# Patient Record
Sex: Female | Born: 1975
Health system: Southern US, Community
[De-identification: ages and names within clinical notes are randomized; demographics above are authoritative.]

## PROBLEM LIST (undated history)

## (undated) DIAGNOSIS — D219 Benign neoplasm of connective and other soft tissue, unspecified: Secondary | ICD-10-CM

## (undated) DIAGNOSIS — R062 Wheezing: Secondary | ICD-10-CM

## (undated) DIAGNOSIS — J069 Acute upper respiratory infection, unspecified: Secondary | ICD-10-CM

## (undated) DIAGNOSIS — Z973 Presence of spectacles and contact lenses: Secondary | ICD-10-CM

## (undated) DIAGNOSIS — J45909 Unspecified asthma, uncomplicated: Secondary | ICD-10-CM

## (undated) DIAGNOSIS — D649 Anemia, unspecified: Secondary | ICD-10-CM

## (undated) DIAGNOSIS — F988 Other specified behavioral and emotional disorders with onset usually occurring in childhood and adolescence: Secondary | ICD-10-CM

## (undated) DIAGNOSIS — N946 Dysmenorrhea, unspecified: Secondary | ICD-10-CM

## (undated) DIAGNOSIS — I10 Essential (primary) hypertension: Secondary | ICD-10-CM

## (undated) DIAGNOSIS — R87619 Unspecified abnormal cytological findings in specimens from cervix uteri: Secondary | ICD-10-CM

## (undated) DIAGNOSIS — IMO0002 Reserved for concepts with insufficient information to code with codable children: Secondary | ICD-10-CM

## (undated) HISTORY — DX: Essential (primary) hypertension: I10

## (undated) HISTORY — DX: Dysmenorrhea, unspecified: N94.6

## (undated) HISTORY — DX: Acute upper respiratory infection, unspecified: J06.9

## (undated) HISTORY — DX: Benign neoplasm of connective and other soft tissue, unspecified: D21.9

## (undated) HISTORY — DX: Unspecified abnormal cytological findings in specimens from cervix uteri: R87.619

## (undated) HISTORY — DX: Anemia, unspecified: D64.9

## (undated) HISTORY — DX: Wheezing: R06.2

## (undated) HISTORY — DX: Reserved for concepts with insufficient information to code with codable children: IMO0002

## (undated) HISTORY — PX: CERVICAL CONE BIOPSY: SUR198

---
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
## 2011-06-24 LAB — HM PAP SMEAR

## 2011-08-08 ENCOUNTER — Encounter: Payer: Self-pay | Admitting: Obstetrics & Gynecology

## 2011-08-16 ENCOUNTER — Encounter: Payer: Self-pay | Admitting: Obstetrics & Gynecology

## 2011-09-20 ENCOUNTER — Ambulatory Visit (INDEPENDENT_AMBULATORY_CARE_PROVIDER_SITE_OTHER): Payer: 59 | Admitting: Obstetrics & Gynecology

## 2011-09-20 ENCOUNTER — Encounter: Payer: Self-pay | Admitting: Obstetrics & Gynecology

## 2011-09-20 DIAGNOSIS — N920 Excessive and frequent menstruation with regular cycle: Secondary | ICD-10-CM | POA: Insufficient documentation

## 2011-09-20 DIAGNOSIS — D649 Anemia, unspecified: Secondary | ICD-10-CM | POA: Insufficient documentation

## 2011-09-20 DIAGNOSIS — Z87410 Personal history of cervical dysplasia: Secondary | ICD-10-CM | POA: Insufficient documentation

## 2011-09-20 DIAGNOSIS — N9489 Other specified conditions associated with female genital organs and menstrual cycle: Secondary | ICD-10-CM

## 2011-09-20 NOTE — Patient Instructions (Signed)

## 2011-09-20 NOTE — Progress Notes (Signed)
  Subjective:    Patient ID: Michelle Evans, female    DOB: 08/05/1975, 36 y.o.   MRN: [id number]  HPI 36 yo G2P2 sent to me for evaluation of abnormal appearing cervix.  Pt has history of LEEP vs CKC (pt was under general anesthesia at in the hosptial for procedure).  Last pap was satisfactory for interpretation and negative.  Pt has not had HPV testing.  Leep was 19 years ago.  Pt also has history of heavy menses and anemia.  This has been true her whole life.  She also has dysmenorrhea.     Review of Systems  Constitutional: Negative.   Respiratory: Negative.   Cardiovascular: Negative.   Gastrointestinal: Negative.   Genitourinary:       Dysmenorrhea        Objective:   Physical Exam  Vitals reviewed. Constitutional: She is oriented to person, place, and time. She appears well-developed and well-nourished. No distress.  HENT:  Head: Normocephalic and atraumatic.  Eyes: Conjunctivae are normal.  Pulmonary/Chest: Effort normal.  Abdominal: Soft. She exhibits no distension and no mass. There is no tenderness. There is no rebound and no guarding.  Genitourinary: Vagina normal. No vaginal discharge found.       Uterus anteverted and small.  ? Broad ligament fibroid vs adnexal mass.  No pain on exam.  No left adnexal mass.  Cervix has 3 nabothian cysts 5 o'clock, 11 o'clock, nd 12 o'clock.  There is also evidence of scarring from prior LEEP / CKC  Neurological: She is alert and oriented to person, place, and time.  Skin: Skin is warm and dry.  Psychiatric: She has a normal mood and affect.          Assessment & Plan:  36 yo female with nml pap smear and nabothian cysts.  History of severe dysplasia 19 yrs ago with LEEP vs CKC.   Pt also has dysmenorrhea, menorrhagia, and anemia.  1-HPV next visit in case lesion 19 years ago was glandular.  Would always use co-testing with this patient 2-right adnexal mass--TV US 3-RTC 2 weeks for results and plan.

## 2011-09-22 ENCOUNTER — Other Ambulatory Visit: Payer: Self-pay | Admitting: Obstetrics & Gynecology

## 2011-09-22 DIAGNOSIS — N9489 Other specified conditions associated with female genital organs and menstrual cycle: Secondary | ICD-10-CM

## 2011-09-25 ENCOUNTER — Ambulatory Visit (HOSPITAL_BASED_OUTPATIENT_CLINIC_OR_DEPARTMENT_OTHER)
Admission: RE | Admit: 2011-09-25 | Discharge: 2011-09-25 | Disposition: A | Discharge status: Home / Self Care | Payer: 59 | Source: Ambulatory Visit | Attending: Obstetrics & Gynecology | Admitting: Obstetrics & Gynecology

## 2011-09-25 ENCOUNTER — Ambulatory Visit (INDEPENDENT_AMBULATORY_CARE_PROVIDER_SITE_OTHER)
Admission: RE | Admit: 2011-09-25 | Discharge: 2011-09-25 | Disposition: A | Discharge status: Home / Self Care | Payer: 59 | Source: Ambulatory Visit | Attending: Obstetrics & Gynecology | Admitting: Obstetrics & Gynecology

## 2011-09-25 DIAGNOSIS — N920 Excessive and frequent menstruation with regular cycle: Secondary | ICD-10-CM

## 2011-09-25 DIAGNOSIS — N9489 Other specified conditions associated with female genital organs and menstrual cycle: Secondary | ICD-10-CM

## 2011-09-25 DIAGNOSIS — D251 Intramural leiomyoma of uterus: Secondary | ICD-10-CM | POA: Insufficient documentation

## 2013-02-19 ENCOUNTER — Ambulatory Visit (INDEPENDENT_AMBULATORY_CARE_PROVIDER_SITE_OTHER): Payer: 59 | Admitting: Family Medicine

## 2013-02-19 ENCOUNTER — Encounter: Payer: Self-pay | Admitting: Family Medicine

## 2013-02-19 VITALS — BP 110/78 | HR 80 | Wt 177.0 lb

## 2013-02-19 DIAGNOSIS — R5381 Other malaise: Secondary | ICD-10-CM

## 2013-02-19 DIAGNOSIS — R5383 Other fatigue: Secondary | ICD-10-CM

## 2013-02-19 DIAGNOSIS — E663 Overweight: Secondary | ICD-10-CM

## 2013-02-19 DIAGNOSIS — F17200 Nicotine dependence, unspecified, uncomplicated: Secondary | ICD-10-CM

## 2013-02-19 MED ORDER — CYANOCOBALAMIN 1000 MCG/ML IJ SOLN: 1000.0000 ug | INTRAMUSCULAR | Status: DC

## 2013-02-19 MED ORDER — PHENTERMINE HCL 37.5 MG PO TABS: 37.5000 mg | ORAL_TABLET | Freq: Every day | ORAL | Status: DC

## 2013-02-19 NOTE — Patient Instructions (Addendum)
1)  Fatigue - Inject 1 ml of your B-12 monthly.    2)  Smoking Cessation - Read or listen to "The Easy Way To Quit Smoking" by Malachi Pro.  3)  Weight - Take 1/2 - 1 of Phentermine and limit your calories to 1250 or less.     Smoking Cessation  Quitting smoking is important to your health and has many advantages. However, it is not always easy to quit since nicotine is a very addictive drug. Often times, people try 3 times or more before being able to quit. This document explains the best ways for you to prepare to quit smoking. Quitting takes hard work and a lot of effort, but you can do it. ADVANTAGES OF QUITTING SMOKING  You will live longer, feel better, and live better.  Your body will feel the impact of quitting smoking almost immediately.  Within 20 minutes, blood pressure decreases. Your pulse returns to its normal level.  After 8 hours, carbon monoxide levels in the blood return to normal. Your oxygen level increases.  After 24 hours, the chance of having a heart attack starts to decrease. Your breath, hair, and body stop smelling like smoke.  After 48 hours, damaged nerve endings begin to recover. Your sense of taste and smell improve.  After 72 hours, the body is virtually free of nicotine. Your bronchial tubes relax and breathing becomes easier.  After 2 to 12 weeks, lungs can hold more air. Exercise becomes easier and circulation improves.  The risk of having a heart attack, stroke, cancer, or lung disease is greatly reduced.  After 1 year, the risk of coronary heart disease is cut in half.  After 5 years, the risk of stroke falls to the same as a nonsmoker.  After 10 years, the risk of lung cancer is cut in half and the risk of other cancers decreases significantly.  After 15 years, the risk of coronary heart disease drops, usually to the level of a nonsmoker.  If you are pregnant, quitting smoking will improve your chances of having a healthy baby.  The people  you live with, especially any children, will be healthier.  You will have extra money to spend on things other than cigarettes. QUESTIONS TO THINK ABOUT BEFORE ATTEMPTING TO QUIT You may want to talk about your answers with your caregiver.  Why do you want to quit?  If you tried to quit in the past, what helped and what did not?  What will be the most difficult situations for you after you quit? How will you plan to handle them?  Who can help you through the tough times? Your family? Friends? A caregiver?  What pleasures do you get from smoking? What ways can you still get pleasure if you quit? Here are some questions to ask your caregiver:  How can you help me to be successful at quitting?  What medicine do you think would be best for me and how should I take it?  What should I do if I need more help?  What is smoking withdrawal like? How can I get information on withdrawal? GET READY  Set a quit date.  Change your environment by getting rid of all cigarettes, ashtrays, matches, and lighters in your home, car, or work. Do not let people smoke in your home.  Review your past attempts to quit. Think about what worked and what did not. GET SUPPORT AND ENCOURAGEMENT You have a better chance of being successful if you have  help. You can get support in many ways.  Tell your family, friends, and co-workers that you are going to quit and need their support. Ask them not to smoke around you.  Get individual, group, or telephone counseling and support. Programs are available at [employer] and health centers. Call your local health department for information about programs in your area.  Spiritual beliefs and practices may help some smokers quit.  Download a "quit meter" on your computer to keep track of quit statistics, such as how long you have gone without smoking, cigarettes not smoked, and money saved.  Get a self-help book about quitting smoking and staying off of  tobacco. LEARN NEW SKILLS AND BEHAVIORS  Distract yourself from urges to smoke. Talk to someone, go for a walk, or occupy your time with a task.  Change your normal routine. Take a different route to work. Drink tea instead of coffee. Eat breakfast in a different place.  Reduce your stress. Take a hot bath, exercise, or read a book.  Plan something enjoyable to do every day. Reward yourself for not smoking.  Explore interactive web-based programs that specialize in helping you quit. GET MEDICINE AND USE IT CORRECTLY Medicines can help you stop smoking and decrease the urge to smoke. Combining medicine with the above behavioral methods and support can greatly increase your chances of successfully quitting smoking.  Nicotine replacement therapy helps deliver nicotine to your body without the negative effects and risks of smoking. Nicotine replacement therapy includes nicotine gum, lozenges, inhalers, nasal sprays, and skin patches. Some may be available over-the-counter and others require a prescription.  Antidepressant medicine helps people abstain from smoking, but how this works is unknown. This medicine is available by prescription.  Nicotinic receptor partial agonist medicine simulates the effect of nicotine in your brain. This medicine is available by prescription. Ask your caregiver for advice about which medicines to use and how to use them based on your health history. Your caregiver will tell you what side effects to look out for if you choose to be on a medicine or therapy. Carefully read the information on the package. Do not use any other product containing nicotine while using a nicotine replacement product.  RELAPSE OR DIFFICULT SITUATIONS Most relapses occur within the first 3 months after quitting. Do not be discouraged if you start smoking again. Remember, most people try several times before finally quitting. You may have symptoms of withdrawal because your body is used to  nicotine. You may crave cigarettes, be irritable, feel very hungry, cough often, get headaches, or have difficulty concentrating. The withdrawal symptoms are only temporary. They are strongest when you first quit, but they will go away within 10 14 days. To reduce the chances of relapse, try to:  Avoid drinking alcohol. Drinking lowers your chances of successfully quitting.  Reduce the amount of caffeine you consume. Once you quit smoking, the amount of caffeine in your body increases and can give you symptoms, such as a rapid heartbeat, sweating, and anxiety.  Avoid smokers because they can make you want to smoke.  Do not let weight gain distract you. Many smokers will gain weight when they quit, usually less than 10 pounds. Eat a healthy diet and stay active. You can always lose the weight gained after you quit.  Find ways to improve your mood other than smoking. FOR MORE INFORMATION  www.smokefree.gov  Document Released: 07/04/2001 Document Revised: 01/09/2012 Document Reviewed: 10/19/2011 [facility] Patient Information 2014 [place], [place].

## 2013-02-19 NOTE — Progress Notes (Signed)
  Subjective:    Patient ID: Michelle Evans, female    DOB: 1976/04/24, 37 y.o.   MRN: [id number]  HPI  Michelle Evans is here today for a couple of issues.  She needs to have her Vitamin B-12 refilled.  She feels that it definitely improves her fatigue.  She would also like some help with her weight.   She has been able to maintain her weight since doing the HCG program a year ago.  She has reduced her sugar intake and her portions.  She would like to get a refill for the Phentermine.  She also continues to smoke.  She is not really ready to quit at this time.  One concern she has is that she may gain weight.    Review of Systems  Constitutional: Positive for fatigue. Negative for appetite change and unexpected weight change.  Cardiovascular: Negative for palpitations.  Neurological: Negative for light-headedness.   Past Medical History  Diagnosis Date  . Anemia   . Abnormal Pap smear     18 yrs ago  cone biopsy  . Dysmenorrhea   . Fibroid tumor   . Acute upper respiratory infections of other multiple sites   . Wheezing    Family History  Problem Relation Age of Onset  . Diabetes Maternal Grandmother   . Hypertension Maternal Grandmother   . Heart disease Maternal Grandmother   . Hypertension Mother   . Cancer Maternal Grandfather     colon and prostate  . 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    Education: Some College    Tobacco Use/Exposure:  Current every day smoker.  She smokes about 5 cigarettes per day. She has smoked for 16 years.     Alcohol Use:  Occasional (Mixed Drinks)    Drug Use:  None   Diet:  Regular   Exercise:  Waking 2-3 miles per week    Hobbies: Reading, Shopping                Objective:   Physical Exam  Constitutional:  She appears well-nourished. No distress.  HENT:  Head: Normocephalic.  Eyes: No scleral icterus.  Neck: No thyromegaly present.  Cardiovascular: Normal rate, regular rhythm and normal heart sounds.   Pulmonary/Chest: Effort normal and breath sounds normal.  Abdominal: There is no tenderness.  Musculoskeletal: She exhibits no edema and no tenderness.  Neurological: She is alert.  Skin: Skin is warm and dry.  Psychiatric: She has a normal mood and affect. Her behavior is normal. Judgment and thought content normal.          Assessment & Plan:

## 2013-02-24 DIAGNOSIS — R5381 Other malaise: Secondary | ICD-10-CM | POA: Insufficient documentation

## 2013-02-24 DIAGNOSIS — F17200 Nicotine dependence, unspecified, uncomplicated: Secondary | ICD-10-CM | POA: Insufficient documentation

## 2013-02-24 DIAGNOSIS — R5383 Other fatigue: Secondary | ICD-10-CM | POA: Insufficient documentation

## 2013-02-24 DIAGNOSIS — E663 Overweight: Secondary | ICD-10-CM | POA: Insufficient documentation

## 2013-02-24 NOTE — Assessment & Plan Note (Addendum)
She admits that she is really not ready to quit right now.  We might put her on Wellbutrin after she completes a month of the Phentermine.  She may be more ready once she lose another 10-15 lbs.  She is to look into reading [employer] book on "The Easy Way To Quit Smoking".

## 2013-02-24 NOTE — Assessment & Plan Note (Signed)
She will work hard on her diet and exercise for the next 4-6 weeks.

## 2013-02-24 NOTE — Assessment & Plan Note (Signed)
She was given a refill for her Vitamin B-12.

## 2013-09-17 ENCOUNTER — Encounter: Payer: Self-pay | Admitting: Family Medicine

## 2013-09-17 ENCOUNTER — Ambulatory Visit (INDEPENDENT_AMBULATORY_CARE_PROVIDER_SITE_OTHER): Payer: 59 | Admitting: Family Medicine

## 2013-09-17 VITALS — BP 119/80 | HR 78 | Resp 16 | Wt 183.0 lb

## 2013-09-17 DIAGNOSIS — R05 Cough: Secondary | ICD-10-CM

## 2013-09-17 DIAGNOSIS — R059 Cough, unspecified: Secondary | ICD-10-CM

## 2013-09-17 DIAGNOSIS — R0981 Nasal congestion: Secondary | ICD-10-CM

## 2013-09-17 DIAGNOSIS — J3489 Other specified disorders of nose and nasal sinuses: Secondary | ICD-10-CM

## 2013-09-17 DIAGNOSIS — B373 Candidiasis of vulva and vagina: Secondary | ICD-10-CM

## 2013-09-17 DIAGNOSIS — J329 Chronic sinusitis, unspecified: Secondary | ICD-10-CM

## 2013-09-17 DIAGNOSIS — B3731 Acute candidiasis of vulva and vagina: Secondary | ICD-10-CM

## 2013-09-17 MED ORDER — AZITHROMYCIN 500 MG PO TABS: 500.0000 mg | ORAL_TABLET | Freq: Every day | ORAL | Status: AC

## 2013-09-17 MED ORDER — FLUCONAZOLE 150 MG PO TABS: 150.0000 mg | ORAL_TABLET | Freq: Once | ORAL | Status: DC

## 2013-09-17 MED ORDER — MOMETASONE FUROATE 50 MCG/ACT NA SUSP: 2.0000 | Freq: Every day | NASAL | Status: DC

## 2013-09-17 MED ORDER — TERCONAZOLE 0.8 % VA CREA: 1.0000 | TOPICAL_CREAM | Freq: Every day | VAGINAL | Status: AC

## 2013-09-17 MED ORDER — HYDROCOD POLST-CHLORPHEN POLST 10-8 MG/5ML PO LQCR: 5.0000 mL | Freq: Two times a day (BID) | ORAL | Status: DC | PRN

## 2013-09-17 MED ORDER — IBUPROFEN 800 MG PO TABS: 800.0000 mg | ORAL_TABLET | Freq: Three times a day (TID) | ORAL | Status: DC | PRN

## 2013-09-17 MED ORDER — METHYLPREDNISOLONE SODIUM SUCC 125 MG IJ SOLR
125.0000 mg | Freq: Once | INTRAMUSCULAR | Status: AC
  Administered 2013-09-17: 125 mg via INTRAMUSCULAR

## 2013-09-17 NOTE — Progress Notes (Signed)
Subjective:    Patient ID: Michelle Evans, female    DOB: 03/24/1976, 38 y.o.   MRN: [id number]  Michelle Evans is here today complaining of pain and pressure in the left side of her face.  She feels that she may have a sinus infection.    Sinus Problem The current episode started 1 to 4 weeks ago. The problem has been gradually worsening since onset. There has been no fever. Her pain is at a severity of 5/10. The pain is moderate. Associated symptoms include congestion, coughing, ear pain, headaches and sinus pressure. Pertinent negatives include no chills. (Left) Treatments tried: Mucinex, Sudafed, Ibuprofen, Nyquil and Saline rinse and Zyrtec. The treatment provided no relief.    Review of Systems  Constitutional: Negative for fever and chills.  HENT: Positive for congestion, ear pain, postnasal drip, rhinorrhea and sinus pressure.        Left Side  Eyes: Negative.   Respiratory: Positive for cough.   Neurological: Positive for headaches.     Past Medical History  Diagnosis Date  . Anemia   . Abnormal Pap smear     18 yrs ago  cone biopsy  . Dysmenorrhea   . Fibroid tumor   . Acute upper respiratory infections of other multiple sites   . Wheezing      Past Surgical History  Procedure Laterality Date  . 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    Education: Some College    Tobacco Use/Exposure:  Current every day smoker.  She smokes about 5 cigarettes per day. She has smoked for 16 years.     Alcohol Use:  Occasional (Mixed Drinks)    Drug Use:  None   Diet:  Regular   Exercise:  Waking 2-3 miles per week    Hobbies: Reading, Shopping              Family History  Problem Relation Age of Onset  . Diabetes Maternal Grandmother   .  Hypertension Maternal Grandmother   . Heart disease Maternal Grandmother   . Hypertension Mother   . Cancer Maternal Grandfather     colon and prostate  . CVA Maternal Grandfather      Current Outpatient Prescriptions on File Prior to Visit  Medication Sig Dispense Refill  . albuterol (PROVENTIL HFA;VENTOLIN HFA) 108 (90 BASE) MCG/ACT inhaler Inhale 2 puffs into the lungs every 6 (six) hours as needed for wheezing.      . cyanocobalamin (,VITAMIN B-12,) 1000 MCG/ML injection Inject 1 mL (1,000 mcg total) into the muscle every 30 (thirty) days.  30 mL  1  . pseudoephedrine (SUDAFED) 30 MG tablet Take 30 mg by mouth every 4 (four) hours as needed for congestion.       No current facility-administered medications on file prior to visit.     Allergies  Allergen Reactions  . Theophyllines Hives and Itching       Objective:   Physical Exam  Constitutional: She appears well-nourished. No distress.  HENT:  Head: Normocephalic.  Left Ear: Hearing and tympanic membrane normal. There is tenderness. No drainage.  Nose: Left sinus exhibits maxillary sinus tenderness and frontal sinus tenderness.  Mouth/Throat: No oropharyngeal exudate.  Eyes: Conjunctivae are normal. Right eye exhibits no discharge. Left eye exhibits no discharge.  Neck: Neck supple.  Cardiovascular: Normal rate, regular rhythm and normal heart sounds.  Exam reveals no gallop and no friction rub.   No murmur heard. Pulmonary/Chest: Effort normal and breath sounds normal. She has no wheezes. She exhibits no tenderness.  Lymphadenopathy:    She has no cervical adenopathy.  Neurological: She is alert.  Skin: Skin is warm and dry. No rash noted.  Psychiatric: She has a normal mood and affect.      Assessment & Plan:    Omega was seen today for sinus problem.  Diagnoses and associated orders for this visit:  Cough - chlorpheniramine-HYDROcodone (TUSSIONEX PENNKINETIC ER) 10-8 MG/5ML LQCR; Take 5 mLs by mouth every  12 (twelve) hours as needed.  Sinus infection - azithromycin (ZITHROMAX) 500 MG tablet; Take 1 tablet (500 mg total) by mouth daily. Take 1 tablet daily for 3 days. - ibuprofen (ADVIL,MOTRIN) 800 MG tablet; Take 1 tablet (800 mg total) by mouth every 8 (eight) hours as needed. - methylPREDNISolone sodium succinate (SOLU-MEDROL) 125 mg/2 mL injection 125 mg; Inject 2 mLs (125 mg total) into the muscle once.  Nasal congestion - mometasone (NASONEX) 50 MCG/ACT nasal spray; Place 2 sprays into the nose daily.  Candidiasis of female genitalia - fluconazole (DIFLUCAN) 150 MG tablet; Take 1 tablet (150 mg total) by mouth once. - terconazole (TERAZOL 3) 0.8 % vaginal cream; Place 1 applicator vaginally at bedtime.

## 2013-09-18 ENCOUNTER — Ambulatory Visit: Payer: 59 | Admitting: Family Medicine

## 2013-09-18 DIAGNOSIS — R0981 Nasal congestion: Secondary | ICD-10-CM | POA: Insufficient documentation

## 2013-09-18 DIAGNOSIS — R059 Cough, unspecified: Secondary | ICD-10-CM | POA: Insufficient documentation

## 2013-09-18 DIAGNOSIS — J329 Chronic sinusitis, unspecified: Secondary | ICD-10-CM | POA: Insufficient documentation

## 2013-09-18 DIAGNOSIS — B3731 Acute candidiasis of vulva and vagina: Secondary | ICD-10-CM | POA: Insufficient documentation

## 2013-09-18 DIAGNOSIS — B373 Candidiasis of vulva and vagina: Secondary | ICD-10-CM | POA: Insufficient documentation

## 2013-09-18 DIAGNOSIS — R05 Cough: Secondary | ICD-10-CM | POA: Insufficient documentation

## 2013-09-18 NOTE — Patient Instructions (Signed)
1)  Sinus Congestion - You received an injection of Solu-Medrol at today's visit; Take a combination of Mucinex D in the morning and Zyrtec at night; Use Nasonex at night.  If you don't improve or worsen, you can start the Zithromax.    2)  Cough - Tussionex as needed  Sinus Headache A sinus headache is when your sinuses become clogged or swollen. Sinus headaches can range from mild to severe.  CAUSES A sinus headache can have different causes, such as:  Colds.  Sinus infections.  Allergies. SYMPTOMS  Symptoms of a sinus headache may vary and can include:  Headache.  Pain or pressure in the face.  Congested or runny nose.  Fever.  Inability to smell.  Pain in upper teeth. Weather changes can make symptoms worse. TREATMENT  The treatment of a sinus headache depends on the cause.  Sinus pain caused by a sinus infection may be treated with antibiotic medicine.  Sinus pain caused by allergies may be helped by allergy medicines (antihistamines) and medicated nasal sprays.  Sinus pain caused by congestion may be helped by flushing the nose and sinuses with saline solution. HOME CARE INSTRUCTIONS   If antibiotics are prescribed, take them as directed. Finish them even if you start to feel better.  Only take over-the-counter or prescription medicines for pain, discomfort, or fever as directed by your caregiver.  If you have congestion, use a nasal spray to help reduce pressure. SEEK IMMEDIATE MEDICAL CARE IF:  You have a fever.  You have headaches more than once a week.  You have sensitivity to light or sound.  You have repeated nausea and vomiting.  You have vision problems.  You have sudden, severe pain in your face or head.  You have a seizure.  You are confused.  Your sinus headaches do not get better after treatment. Many people think they have a sinus headache when they actually have migraines or tension headaches. MAKE SURE YOU:   Understand these  instructions.  Will watch your condition.  Will get help right away if you are not doing well or get worse. Document Released: 08/17/2004 Document Revised: 10/02/2011 Document Reviewed: 10/08/2010 [facility] Patient Information 2014 [place].

## 2014-01-05 ENCOUNTER — Other Ambulatory Visit: Payer: Self-pay | Admitting: *Deleted

## 2014-01-05 DIAGNOSIS — Z0000 Encounter for general adult medical examination without abnormal findings: Secondary | ICD-10-CM

## 2014-01-06 ENCOUNTER — Other Ambulatory Visit: Payer: 59

## 2014-01-06 LAB — COMPLETE METABOLIC PANEL WITH GFR
ALT: 11 U/L (ref 0–35)
AST: 13 U/L (ref 0–37)
Albumin: 4.3 g/dL (ref 3.5–5.2)
Alkaline Phosphatase: 47 U/L (ref 39–117)
BUN: 10 mg/dL (ref 6–23)
CO2: 20 mEq/L (ref 19–32)
Calcium: 9.3 mg/dL (ref 8.4–10.5)
Chloride: 105 mEq/L (ref 96–112)
Creat: 0.99 mg/dL (ref 0.50–1.10)
GFR, Est African American: 84 mL/min
GFR, Est Non African American: 73 mL/min
Glucose, Bld: 80 mg/dL (ref 70–99)
Potassium: 4.2 mEq/L (ref 3.5–5.3)
Sodium: 138 mEq/L (ref 135–145)
Total Bilirubin: 0.9 mg/dL (ref 0.2–1.2)
Total Protein: 6.9 g/dL (ref 6.0–8.3)

## 2014-01-06 LAB — CBC WITH DIFFERENTIAL/PLATELET
Basophils Absolute: 0 10*3/uL (ref 0.0–0.1)
Basophils Relative: 0 % (ref 0–1)
Eosinophils Absolute: 0.2 10*3/uL (ref 0.0–0.7)
Eosinophils Relative: 2 % (ref 0–5)
HCT: 39.8 % (ref 36.0–46.0)
Hemoglobin: 13.2 g/dL (ref 12.0–15.0)
Lymphocytes Relative: 33 % (ref 12–46)
Lymphs Abs: 2.6 10*3/uL (ref 0.7–4.0)
MCH: 30.3 pg (ref 26.0–34.0)
MCHC: 33.2 g/dL (ref 30.0–36.0)
MCV: 91.5 fL (ref 78.0–100.0)
Monocytes Absolute: 0.6 10*3/uL (ref 0.1–1.0)
Monocytes Relative: 8 % (ref 3–12)
Neutro Abs: 4.4 10*3/uL (ref 1.7–7.7)
Neutrophils Relative %: 57 % (ref 43–77)
Platelets: 221 10*3/uL (ref 150–400)
RBC: 4.35 MIL/uL (ref 3.87–5.11)
RDW: 13.6 % (ref 11.5–15.5)
WBC: 7.8 10*3/uL (ref 4.0–10.5)

## 2014-01-06 LAB — TSH: TSH: 1.951 u[IU]/mL (ref 0.350–4.500)

## 2014-01-06 LAB — LIPID PANEL
Cholesterol: 170 mg/dL (ref 0–200)
HDL: 59 mg/dL (ref >39)
LDL Cholesterol: 92 mg/dL (ref 0–99)
Total CHOL/HDL Ratio: 2.9 Ratio
Triglycerides: 94 mg/dL (ref <150)
VLDL: 19 mg/dL (ref 0–40)

## 2014-01-07 LAB — VITAMIN D 25 HYDROXY (VIT D DEFICIENCY, FRACTURES): Vit D, 25-Hydroxy: 15 ng/mL — ABNORMAL LOW (ref 30–89)

## 2014-01-12 ENCOUNTER — Other Ambulatory Visit: Payer: 59 | Admitting: Family Medicine

## 2014-01-26 ENCOUNTER — Ambulatory Visit (INDEPENDENT_AMBULATORY_CARE_PROVIDER_SITE_OTHER): Payer: 59 | Admitting: Family Medicine

## 2014-01-26 ENCOUNTER — Encounter: Payer: Self-pay | Admitting: Family Medicine

## 2014-01-26 ENCOUNTER — Other Ambulatory Visit (HOSPITAL_COMMUNITY)
Admission: RE | Admit: 2014-01-26 | Discharge: 2014-01-26 | Disposition: A | Discharge status: Home / Self Care | Payer: 59 | Source: Ambulatory Visit | Attending: Family Medicine | Admitting: Family Medicine

## 2014-01-26 VITALS — BP 136/84 | HR 94 | Resp 16 | Ht 67.5 in | Wt 183.0 lb

## 2014-01-26 DIAGNOSIS — R5383 Other fatigue: Secondary | ICD-10-CM

## 2014-01-26 DIAGNOSIS — Z1151 Encounter for screening for human papillomavirus (HPV): Secondary | ICD-10-CM | POA: Insufficient documentation

## 2014-01-26 DIAGNOSIS — K648 Other hemorrhoids: Secondary | ICD-10-CM

## 2014-01-26 DIAGNOSIS — R41840 Attention and concentration deficit: Secondary | ICD-10-CM

## 2014-01-26 DIAGNOSIS — E559 Vitamin D deficiency, unspecified: Secondary | ICD-10-CM

## 2014-01-26 DIAGNOSIS — R062 Wheezing: Secondary | ICD-10-CM

## 2014-01-26 DIAGNOSIS — Z0000 Encounter for general adult medical examination without abnormal findings: Secondary | ICD-10-CM

## 2014-01-26 DIAGNOSIS — N946 Dysmenorrhea, unspecified: Secondary | ICD-10-CM

## 2014-01-26 DIAGNOSIS — K649 Unspecified hemorrhoids: Secondary | ICD-10-CM

## 2014-01-26 DIAGNOSIS — R5381 Other malaise: Secondary | ICD-10-CM

## 2014-01-26 DIAGNOSIS — Z01419 Encounter for gynecological examination (general) (routine) without abnormal findings: Secondary | ICD-10-CM | POA: Insufficient documentation

## 2014-01-26 DIAGNOSIS — Z124 Encounter for screening for malignant neoplasm of cervix: Secondary | ICD-10-CM

## 2014-01-26 DIAGNOSIS — J309 Allergic rhinitis, unspecified: Secondary | ICD-10-CM

## 2014-01-26 DIAGNOSIS — K5900 Constipation, unspecified: Secondary | ICD-10-CM

## 2014-01-26 LAB — POCT URINALYSIS DIPSTICK
Bilirubin, UA: NEGATIVE
Blood, UA: NEGATIVE
Glucose, UA: NEGATIVE
Ketones, UA: NEGATIVE
Leukocytes, UA: NEGATIVE
Nitrite, UA: NEGATIVE
Spec Grav, UA: 1.015
Urobilinogen, UA: NEGATIVE
pH, UA: 6.5

## 2014-01-26 MED ORDER — VITAMIN D (ERGOCALCIFEROL) 1.25 MG (50000 UNIT) PO CAPS: ORAL_CAPSULE | ORAL | Status: AC

## 2014-01-26 MED ORDER — LISDEXAMFETAMINE DIMESYLATE 50 MG PO CAPS: 50.0000 mg | ORAL_CAPSULE | Freq: Every day | ORAL | Status: DC

## 2014-01-26 MED ORDER — MONTELUKAST SODIUM 10 MG PO TABS: 10.0000 mg | ORAL_TABLET | Freq: Every day | ORAL | Status: DC

## 2014-01-26 MED ORDER — HYDROCORTISONE 2.5 % RE CREA: 1.0000 "application " | TOPICAL_CREAM | Freq: Two times a day (BID) | RECTAL | Status: AC

## 2014-01-26 MED ORDER — LINACLOTIDE 145 MCG PO CAPS: 145.0000 ug | ORAL_CAPSULE | Freq: Every day | ORAL | Status: DC

## 2014-01-26 MED ORDER — PSEUDOEPHEDRINE HCL 30 MG PO TABS: 30.0000 mg | ORAL_TABLET | ORAL | Status: AC | PRN

## 2014-01-26 MED ORDER — NAPROXEN SODIUM 550 MG PO TABS: 550.0000 mg | ORAL_TABLET | Freq: Two times a day (BID) | ORAL | Status: AC

## 2014-01-26 MED ORDER — CYANOCOBALAMIN 1000 MCG/ML IJ SOLN
1000.0000 ug | INTRAMUSCULAR | Status: AC
Start: 2014-01-26 — End: 2015-01-26

## 2014-01-26 MED ORDER — ALBUTEROL SULFATE HFA 108 (90 BASE) MCG/ACT IN AERS: 2.0000 | INHALATION_SPRAY | Freq: Four times a day (QID) | RESPIRATORY_TRACT | Status: DC | PRN

## 2014-01-26 NOTE — Progress Notes (Signed)
Subjective:    Patient ID: Michelle Evans, female    DOB: Feb 28, 1976, 38 y.o.   MRN: [id number]  HPI  Michelle Evans is here today for her annual CPE/PAP. She has been doing well since her last visit with [place]. She would like to discuss her weight. She has gained back some of the weight she had lost after doing the HCG diet and would like to get some phentermine.    Review of Systems  Constitutional: Positive for unexpected weight change. Negative for activity change, appetite change and fatigue.  HENT: Negative for congestion, dental problem, ear pain, hearing loss, trouble swallowing and voice change.   Eyes: Negative for pain, redness and visual disturbance.  Respiratory: Negative for cough and shortness of breath.   Cardiovascular: Negative for chest pain, palpitations and leg swelling.  Gastrointestinal: Negative for nausea, vomiting, abdominal pain, diarrhea, constipation and blood in stool.  Endocrine: Negative for cold intolerance, heat intolerance, polydipsia, polyphagia and polyuria.  Genitourinary: Negative for dysuria, urgency, frequency, hematuria, vaginal discharge, menstrual problem and pelvic pain.  Musculoskeletal: Negative for arthralgias, back pain, joint swelling, myalgias and neck pain.  Skin: Negative for rash.  Neurological: Negative for dizziness, weakness and headaches.  Hematological: Negative for adenopathy. Does not bruise/bleed easily.  Psychiatric/Behavioral: Negative for behavioral problems, sleep disturbance, dysphoric mood and decreased concentration. The patient is not nervous/anxious.   All other systems reviewed and are negative.    Past Medical History  Diagnosis Date  . Anemia   . Abnormal Pap smear     18 yrs ago  cone biopsy  . Dysmenorrhea   . Fibroid tumor   . Acute upper respiratory infections of other multiple sites   . Wheezing      Past Surgical History  Procedure Laterality Date  . 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    Education: Some College    Tobacco Use/Exposure:  Current every day smoker.  She smokes about 5 cigarettes per day. She has smoked for 16 years.     Alcohol Use:  Occasional (Mixed Drinks)    Drug Use:  None   Diet:  Regular   Exercise:  Waking 2-3 miles per week    Hobbies: Reading, Shopping              Family History  Problem Relation Age of Onset  . Diabetes Maternal Grandmother   . Hypertension Maternal Grandmother   . Heart disease Maternal Grandmother   . Hypertension Mother   . Cancer Maternal Grandfather     colon and prostate  . CVA Maternal Grandfather      Current Outpatient Prescriptions on File Prior to Visit  Medication Sig Dispense Refill  . ibuprofen (ADVIL,MOTRIN) 800 MG tablet Take 1 tablet (800 mg total) by mouth every 8 (eight) hours as needed.  90 tablet  3  . mometasone (NASONEX) 50 MCG/ACT nasal spray Place 2 sprays into the nose daily.  17 g  11   No current facility-administered medications on file prior to visit.     Allergies  Allergen Reactions  . Theophyllines Hives and Itching       Objective:   Physical Exam  Nursing note  and vitals reviewed. Constitutional: She is oriented to person, place, and time. She appears well-developed and well-nourished.  HENT:  Head: Normocephalic and atraumatic.  Right Ear: External ear normal.  Left Ear: External ear normal.  Nose: Nose normal.  Mouth/Throat: Oropharynx is clear and moist.  Eyes: Conjunctivae and EOM are normal. Pupils are equal, round, and reactive to light.  Neck: Normal range of motion. No thyromegaly present.  Cardiovascular: Normal rate, regular rhythm, normal heart sounds and intact distal pulses.  Exam reveals no gallop and no friction rub.   No murmur  heard. Pulmonary/Chest: Effort normal and breath sounds normal. Right breast exhibits no inverted nipple, no mass, no nipple discharge, no skin change and no tenderness. Left breast exhibits no inverted nipple, no mass, no nipple discharge, no skin change and no tenderness. Breasts are symmetrical.  Abdominal: Soft. Bowel sounds are normal. Hernia confirmed negative in the right inguinal area and confirmed negative in the left inguinal area.  Genitourinary: Vagina normal and uterus normal. Pelvic exam was performed with patient supine. There is no rash, tenderness or lesion on the right labia. There is no rash, tenderness or lesion on the left labia. No vaginal discharge found.  Musculoskeletal: Normal range of motion. She exhibits no edema and no tenderness.  Lymphadenopathy:    She has no cervical adenopathy.       Right: No inguinal adenopathy present.       Left: No inguinal adenopathy present.  Neurological: She is alert and oriented to person, place, and time. She has normal reflexes.  Skin: Skin is warm and dry.  Psychiatric: She has a normal mood and affect. Her behavior is normal. Judgment and thought content normal.      Assessment & Plan:    Michelle Evans was seen today for annual exam and medical management of chronic issues.  Diagnoses and associated orders for this visit:  Routine general medical examination at a health care facility - POCT urinalysis dipstick  Screening for malignant neoplasm of the cervix - Cytology - PAP  Other malaise and fatigue - cyanocobalamin (,VITAMIN B-12,) 1000 MCG/ML injection; Inject 1 mL (1,000 mcg total) into the muscle every 30 (thirty) days.  Allergic rhinitis, unspecified allergic rhinitis type - pseudoephedrine (SUDAFED) 30 MG tablet; Take 1 tablet (30 mg total) by mouth every 4 (four) hours as needed for congestion. - montelukast (SINGULAIR) 10 MG tablet; Take 1 tablet (10 mg total) by mouth at bedtime.  Wheezing - albuterol (PROVENTIL  HFA;VENTOLIN HFA) 108 (90 BASE) MCG/ACT inhaler; Inhale 2 puffs into the lungs every 6 (six) hours as needed for wheezing. - montelukast (SINGULAIR) 10 MG tablet; Take 1 tablet (10 mg total) by mouth at bedtime.  Other hemorrhoids - hydrocortisone (PROCTOSOL HC) 2.5 % rectal cream; Place 1 application rectally 2 (two) times daily.  Unspecified constipation - Linaclotide (LINZESS) 145 MCG CAPS capsule; Take 1 capsule (145 mcg total) by mouth daily.  Dysmenorrhea - naproxen sodium (ANAPROX DS) 550 MG tablet; Take 1 tablet (550 mg total) by mouth 2 (two) times daily with a meal.  Concentration deficit -      lisdexamfetamine (VYVANSE) 50 MG capsule; Take 1 capsule (50 mg total) by mouth daily.  Unspecified vitamin D deficiency - Vitamin D, Ergocalciferol, (DRISDOL) 50000 UNITS CAPS capsule; Take 1 capsule on M/W/F as directed for 90 days   TIME SPENT "FACE TO FACE" WITH PATIENT -  57 MINS  .

## 2014-01-28 LAB — CYTOLOGY - PAP

## 2014-02-26 ENCOUNTER — Ambulatory Visit (INDEPENDENT_AMBULATORY_CARE_PROVIDER_SITE_OTHER): Payer: 59 | Admitting: Family Medicine

## 2014-02-26 VITALS — BP 124/80 | HR 76 | Wt 183.0 lb

## 2014-02-26 DIAGNOSIS — R41840 Attention and concentration deficit: Secondary | ICD-10-CM

## 2014-02-26 MED ORDER — LISDEXAMFETAMINE DIMESYLATE 50 MG PO CAPS: 50.0000 mg | ORAL_CAPSULE | Freq: Every day | ORAL | Status: DC

## 2014-02-26 NOTE — Progress Notes (Signed)
Subjective:    Patient ID: Michelle Evans, female    DOB: [birth year], 38 y.o.   MRN: [id number]  HPI  Michelle Evans is here today for a recheck of her concentration. She has been on Vyvanse for the past month. She says that it has worked well for her.     Review of Systems  Constitutional: Positive for appetite change. Negative for fatigue and unexpected weight change.  Cardiovascular: Negative for chest pain and palpitations.  Neurological: Negative for dizziness and headaches.  Psychiatric/Behavioral: Positive for decreased concentration. Negative for behavioral problems, sleep disturbance, dysphoric mood and agitation. The patient is not nervous/anxious and is not hyperactive.      Past Medical History  Diagnosis Date  . Anemia   . Abnormal Pap smear     18 yrs ago  cone biopsy  . Dysmenorrhea   . Fibroid tumor   . Acute upper respiratory infections of other multiple sites   . Wheezing      Past Surgical History  Procedure Laterality Date  . 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    Education: Some College    Tobacco Use/Exposure:  Current every day smoker.  She smokes about 5 cigarettes per day. She has smoked for 16 years.     Alcohol Use:  Occasional (Mixed Drinks)    Drug Use:  None   Diet:  Regular   Exercise:  Waking 2-3 miles per week    Hobbies: Reading, Shopping              Family History  Problem Relation Age of Onset  . Diabetes Maternal Grandmother   . Hypertension Maternal Grandmother   . Heart disease Maternal Grandmother   . Hypertension Mother   . Cancer Maternal Grandfather     colon and prostate  . CVA Maternal Grandfather      Current Outpatient Prescriptions on File Prior to Visit  Medication Sig Dispense  Refill  . albuterol (PROVENTIL HFA;VENTOLIN HFA) 108 (90 BASE) MCG/ACT inhaler Inhale 2 puffs into the lungs every 6 (six) hours as needed for wheezing.  3 Inhaler  3  . cyanocobalamin (,VITAMIN B-12,) 1000 MCG/ML injection Inject 1 mL (1,000 mcg total) into the muscle every 30 (thirty) days.  3 mL  3  . hydrocortisone (PROCTOSOL HC) 2.5 % rectal cream Place 1 application rectally 2 (two) times daily.  30 g  11  . ibuprofen (ADVIL,MOTRIN) 800 MG tablet Take 1 tablet (800 mg total) by mouth every 8 (eight) hours as needed.  90 tablet  3  . Linaclotide (LINZESS) 145 MCG CAPS capsule Take 1 capsule (145 mcg total) by mouth daily.  90 capsule  3  . mometasone (NASONEX) 50 MCG/ACT nasal spray Place 2 sprays into the nose daily.  17 g  11  . montelukast (SINGULAIR) 10 MG tablet Take 1 tablet (10 mg total) by mouth at bedtime.  30 tablet  3  . naproxen sodium (ANAPROX DS) 550 MG tablet Take 1 tablet (550 mg total) by mouth 2 (two) times daily with a meal.  60 tablet  3  . pseudoephedrine (SUDAFED) 30 MG tablet Take 1 tablet (30 mg total) by mouth every 4 (  four) hours as needed for congestion.  100 tablet  5  . Vitamin D, Ergocalciferol, (DRISDOL) 50000 UNITS CAPS capsule Take 1 capsule on M/W/F as directed for 90 days  36 capsule  3   No current facility-administered medications on file prior to visit.     Allergies  Allergen Reactions  . Theophyllines Hives and Itching       Objective:   Physical Exam  Constitutional: She appears well-nourished. No distress.  Cardiovascular: Normal rate, regular rhythm and normal heart sounds.   Pulmonary/Chest: Effort normal and breath sounds normal.  Neurological: She is alert.  Psychiatric: She has a normal mood and affect. Her behavior is normal. Judgment and thought content normal.      Assessment & Plan:    Michelle Evans was seen today for medication refill.  Diagnoses and associated orders for this visit:  Concentration deficit - Discontinue:  lisdexamfetamine (VYVANSE) 50 MG capsule; Take 1 capsule (50 mg total) by mouth daily.

## 2014-03-04 ENCOUNTER — Other Ambulatory Visit: Payer: Self-pay | Admitting: Family Medicine

## 2014-03-04 DIAGNOSIS — R41840 Attention and concentration deficit: Secondary | ICD-10-CM

## 2014-03-04 MED ORDER — LISDEXAMFETAMINE DIMESYLATE 50 MG PO CAPS: 50.0000 mg | ORAL_CAPSULE | Freq: Every day | ORAL | Status: DC

## 2014-03-22 ENCOUNTER — Encounter: Payer: Self-pay | Admitting: Family Medicine

## 2014-05-25 ENCOUNTER — Encounter: Payer: Self-pay | Admitting: Family Medicine

## 2014-07-21 ENCOUNTER — Encounter: Payer: Self-pay | Admitting: *Deleted

## 2014-07-21 ENCOUNTER — Emergency Department
Admission: EM | Admit: 2014-07-21 | Discharge: 2014-07-21 | Disposition: A | Discharge status: Home / Self Care | Payer: 59 | Source: Home / Self Care | Attending: Emergency Medicine | Admitting: Emergency Medicine

## 2014-07-21 DIAGNOSIS — J322 Chronic ethmoidal sinusitis: Secondary | ICD-10-CM

## 2014-07-21 HISTORY — DX: Other specified behavioral and emotional disorders with onset usually occurring in childhood and adolescence: F98.8

## 2014-07-21 MED ORDER — BENZONATATE 100 MG PO CAPS: 100.0000 mg | ORAL_CAPSULE | Freq: Three times a day (TID) | ORAL | Status: DC

## 2014-07-21 MED ORDER — HYDROCOD POLST-CHLORPHEN POLST 10-8 MG/5ML PO LQCR: 5.0000 mL | Freq: Two times a day (BID) | ORAL | Status: DC

## 2014-07-21 MED ORDER — AMOXICILLIN-POT CLAVULANATE 875-125 MG PO TABS: 1.0000 | ORAL_TABLET | Freq: Two times a day (BID) | ORAL | Status: DC

## 2014-07-21 NOTE — Discharge Instructions (Signed)

## 2014-07-21 NOTE — ED Provider Notes (Signed)
CSN: [id number]     Arrival date & time 07/21/14  1819 History   First MD Initiated Contact with Patient 07/21/14 1854     Chief Complaint  Patient presents with  . Cough   (Consider location/radiation/quality/duration/timing/severity/associated sxs/prior Treatment) Patient is a 37 y.o. female presenting with cough. The history is provided by the patient. No language interpreter was used.  Cough Cough characteristics:  Productive Sputum characteristics:  Green Severity:  Moderate Onset quality:  Gradual Duration:  4 weeks Timing:  Constant Progression:  Worsening Chronicity:  New Smoker: no   Relieved by:  Nothing Worsened by:  Nothing tried Ineffective treatments:  None tried Associated symptoms: ear fullness and fever     Past Medical History  Diagnosis Date  . Anemia   . Abnormal Pap smear     18 yrs ago  cone biopsy  . Dysmenorrhea   . Fibroid tumor   . Acute upper respiratory infections of other multiple sites   . Wheezing   . ADD (attention deficit disorder)    Past Surgical History  Procedure Laterality Date  . Cervical cone biopsy     Family History  Problem Relation Age of Onset  . Diabetes Maternal Grandmother   . Hypertension Maternal Grandmother   . Heart disease Maternal Grandmother   . Hypertension Mother   . Cancer Maternal Grandfather     colon and prostate  . CVA Maternal Grandfather    History  Substance Use Topics  . Smoking status: Current Every Day Smoker -- 0.30 packs/day for 16 years    Types: Cigarettes  . Smokeless tobacco: Never Used     Comment: She is trying to quit by reduce the number of cigarrettes she smokes  . Alcohol Use: Yes     Comment: socially   OB History    Gravida Para Term Preterm AB TAB SAB Ectopic Multiple Living   2 2 2       2      Review of Systems  Constitutional: Positive for fever.  Respiratory: Positive for cough.   All other systems reviewed and are negative.   Allergies  Theophyllines  Home  Medications   Prior to Admission medications   Medication Sig Start Date End Date Taking? Authorizing Provider  albuterol (PROVENTIL HFA;VENTOLIN HFA) 108 (90 BASE) MCG/ACT inhaler Inhale 2 puffs into the lungs every 6 (six) hours as needed for wheezing. 01/26/14 01/27/15  Jonathon Resides, MD  amoxicillin-clavulanate (AUGMENTIN) 875-125 MG per tablet Take 1 tablet by mouth every 12 (twelve) hours. 07/21/14   Fransico Meadow, PA-C  benzonatate (TESSALON) 100 MG capsule Take 1 capsule (100 mg total) by mouth every 8 (eight) hours. 07/21/14   Fransico Meadow, PA-C  chlorpheniramine-HYDROcodone [facility]) 10-8 MG/5ML LQCR Take 5 mLs by mouth 2 (two) times daily. 07/21/14   Fransico Meadow, PA-C  cyanocobalamin (,VITAMIN B-12,) 1000 MCG/ML injection Inject 1 mL (1,000 mcg total) into the muscle every 30 (thirty) days. 01/26/14 01/26/15  Jonathon Resides, MD  hydrocortisone (PROCTOSOL HC) 2.5 % rectal cream Place 1 application rectally 2 (two) times daily. 01/26/14 01/27/15  Jonathon Resides, MD  ibuprofen (ADVIL,MOTRIN) 800 MG tablet Take 1 tablet (800 mg total) by mouth every 8 (eight) hours as needed. 09/17/13   Jonathon Resides, MD  Linaclotide (LINZESS) 145 MCG CAPS capsule Take 1 capsule (145 mcg total) by mouth daily. 01/26/14 01/27/15  Jonathon Resides, MD  lisdexamfetamine (VYVANSE) 50 MG capsule Take 1 capsule (  50 mg total) by mouth daily. 03/04/14 03/05/15  Jonathon Resides, MD  mometasone (NASONEX) 50 MCG/ACT nasal spray Place 2 sprays into the nose daily. 09/17/13   Jonathon Resides, MD  montelukast (SINGULAIR) 10 MG tablet Take 1 tablet (10 mg total) by mouth at bedtime. 01/26/14 01/27/15  Jonathon Resides, MD  naproxen sodium (ANAPROX DS) 550 MG tablet Take 1 tablet (550 mg total) by mouth 2 (two) times daily with a meal. 01/26/14 01/27/15  Jonathon Resides, MD  pseudoephedrine (SUDAFED) 30 MG tablet Take 1 tablet (30 mg total) by mouth every 4 (four) hours as needed for congestion. 01/26/14 01/27/15  Jonathon Resides, MD   Vitamin D, Ergocalciferol, (DRISDOL) 50000 UNITS CAPS capsule Take 1 capsule on M/W/F as directed for 90 days 01/26/14 01/27/15  Jonathon Resides, MD   BP 142/85 mmHg  Pulse 79  Temp(Src) 98.3 F (36.8 C) (Oral)  Resp 16  Ht 5\' 8"  (1.727 m)  Wt 185 lb (83.915 kg)  BMI 28.14 kg/m2  SpO2 100%  LMP 07/19/2014 Physical Exam  Constitutional: She is oriented to person, place, and time. She appears well-developed and well-nourished.  HENT:  Head: Normocephalic.  Eyes: Conjunctivae and EOM are normal. Pupils are equal, round, and reactive to light.  Neck: Normal range of motion.  Cardiovascular: Normal rate and normal heart sounds.   Pulmonary/Chest: Effort normal and breath sounds normal.  Abdominal: Soft. She exhibits no distension.  Musculoskeletal: Normal range of motion.  Neurological: She is alert and oriented to person, place, and time.  Skin: Skin is warm.  Psychiatric: She has a normal mood and affect.  Nursing note and vitals reviewed.   ED Course  Procedures (including critical care time) Labs Review Labs Reviewed - No data to display  Imaging Review No results found.   MDM  Pt counseled on symptoms.     1. Ethmoid sinusitis, unspecified chronicity    augmentin Diflucan tessonate tussiones    [place], [place] 07/21/14 1940

## 2014-07-21 NOTE — ED Notes (Signed)
Pt c/o productive cough and hoarseness x 1 month. Denies fever.

## 2015-07-26 MED FILL — MOMETASONE FUROATE 50 MCG S: 50 | 30 days supply | Qty: 17 | Fill #1 | Status: TO

## 2015-07-26 MED FILL — CYANOCOBALAMIN 1,000 MCG/ML: 1000 | 90 days supply | Qty: 3 | Fill #2

## 2015-07-27 MED FILL — VYVANSE 50 MG CAPSULE: 50 | 30 days supply | Qty: 30 | Fill #0

## 2015-07-28 MED FILL — Q-TUSSIN DM SYRUP: 100-10 | 4 days supply | Qty: 237 | Fill #0

## 2015-07-28 MED FILL — MUCINEX ER 600 MG TABLET: 600 | 10 days supply | Qty: 40 | Fill #0

## 2015-07-29 MED FILL — MONTELUKAST SOD 10 MG TAB: 10 | 90 days supply | Qty: 90 | Fill #2

## 2015-07-30 ENCOUNTER — Telehealth: Payer: 59 | Admitting: Nurse Practitioner

## 2015-07-30 ENCOUNTER — Other Ambulatory Visit: Payer: Self-pay

## 2015-07-30 DIAGNOSIS — R05 Cough: Secondary | ICD-10-CM | POA: Diagnosis not present

## 2015-07-30 DIAGNOSIS — R059 Cough, unspecified: Secondary | ICD-10-CM

## 2015-07-30 MED ORDER — BENZONATATE 100 MG PO CAPS: 100.0000 mg | ORAL_CAPSULE | Freq: Three times a day (TID) | ORAL | Status: DC

## 2015-07-30 MED ORDER — AZITHROMYCIN 250 MG PO TABS: ORAL_TABLET | ORAL | Status: DC

## 2015-07-30 MED FILL — AZITHROMYCIN 250 MG TABLET: 250 | 5 days supply | Qty: 6 | Fill #0

## 2015-07-30 NOTE — Progress Notes (Signed)
We are sorry that you are not feeling well.  Here is how we plan to help!  Based on what you have shared with me it looks like you have upper respiratory tract inflammation that has resulted in a significant cough.  Inflammation and infection in the upper respiratory tract is commonly called bronchitis and has four common causes:  Allergies, Viral Infections, Acid Reflux and Bacterial Infections.  Allergies, viruses and acid reflux are treated by controlling symptoms or eliminating the cause. An example might be a cough caused by taking certain blood pressure medications. You stop the cough by changing the medication. Another example might be a cough caused by acid reflux. Controlling the reflux helps control the cough.  Based on your presentation I believe you most likely have A cough due to bacteria.  When patients have a fever and a productive cough with a change in color or increased sputum production, we are concerned about bacterial bronchitis.  If left untreated it can progress to pneumonia.  If your symptoms do not improve with your treatment plan it is important that you contact your provider.   A prescription for a z pac was sent to your pharmacy. As well as a prescription cough medication called Tessalon Perles 100mg . You may take 1-2 capsules every 8 hours as needed for your cough.    HOME CARE . Only take medications as instructed by your medical team. . Complete the entire course of an antibiotic. . Drink plenty of fluids and get plenty of rest. . Avoid close contacts especially the very [place] and the elderly . Cover your mouth if you cough or cough into your sleeve. . Always remember to wash your hands . A steam or ultrasonic humidifier can help congestion.    GET HELP RIGHT AWAY IF: . You develop worsening fever. . You become short of breath . You cough up blood. . Your symptoms persist after you have completed your treatment plan MAKE SURE YOU   Understand these  instructions.  Will watch your condition.  Will get help right away if you are not doing well or get worse.  Your e-visit answers were reviewed by a board certified advanced clinical practitioner to complete your personal care plan.  Depending on the condition, your plan could have included both over the counter or prescription medications. If there is a problem please reply  once you have received a response from your provider. Your safety is important to [place].  If you have drug allergies check your prescription carefully.    You can use MyChart to ask questions about today's visit, request a non-urgent call back, or ask for a work or school excuse for 24 hours related to this e-Visit. If it has been greater than 24 hours you will need to follow up with your provider, or enter a new e-Visit to address those concerns. You will get an e-mail in the next two days asking about your experience.  I hope that your e-visit has been valuable and will speed your recovery. Thank you for using e-visits.

## 2015-08-13 MED FILL — MUCINEX ER 600 MG TABLET: 600 | 10 days supply | Qty: 40 | Fill #1

## 2015-08-17 DIAGNOSIS — J40 Bronchitis, not specified as acute or chronic: Secondary | ICD-10-CM | POA: Diagnosis not present

## 2015-08-17 DIAGNOSIS — F3281 Premenstrual dysphoric disorder: Secondary | ICD-10-CM | POA: Insufficient documentation

## 2015-08-18 MED FILL — HYDROCODONE-CHLORPHENIRAM S: 10-8 | 24 days supply | Qty: 120 | Fill #0

## 2015-10-05 DIAGNOSIS — F988 Other specified behavioral and emotional disorders with onset usually occurring in childhood and adolescence: Secondary | ICD-10-CM | POA: Diagnosis not present

## 2015-10-05 DIAGNOSIS — J3089 Other allergic rhinitis: Secondary | ICD-10-CM | POA: Diagnosis not present

## 2015-10-05 DIAGNOSIS — Z23 Encounter for immunization: Secondary | ICD-10-CM | POA: Diagnosis not present

## 2015-10-05 DIAGNOSIS — K581 Irritable bowel syndrome with constipation: Secondary | ICD-10-CM | POA: Diagnosis not present

## 2015-10-05 DIAGNOSIS — J4530 Mild persistent asthma, uncomplicated: Secondary | ICD-10-CM | POA: Diagnosis not present

## 2015-10-05 DIAGNOSIS — Z0000 Encounter for general adult medical examination without abnormal findings: Secondary | ICD-10-CM | POA: Diagnosis not present

## 2015-10-05 DIAGNOSIS — R05 Cough: Secondary | ICD-10-CM | POA: Diagnosis not present

## 2015-10-05 DIAGNOSIS — B070 Plantar wart: Secondary | ICD-10-CM | POA: Diagnosis not present

## 2015-10-05 MED FILL — VYVANSE 50 MG CAPSULE: 50 | 30 days supply | Qty: 30 | Fill #0

## 2015-10-05 MED FILL — MONTELUKAST SOD 10 MG TAB: 10 | 90 days supply | Qty: 90 | Fill #0

## 2015-10-05 MED FILL — HYDROCODONE-CHLORPHENIRAM S: 10-8 | 48 days supply | Qty: 240 | Fill #0

## 2015-10-06 ENCOUNTER — Encounter: Payer: Self-pay | Admitting: Podiatry

## 2015-10-06 ENCOUNTER — Ambulatory Visit (HOSPITAL_BASED_OUTPATIENT_CLINIC_OR_DEPARTMENT_OTHER)
Admission: RE | Admit: 2015-10-06 | Discharge: 2015-10-06 | Disposition: A | Discharge status: Home / Self Care | Payer: 59 | Source: Ambulatory Visit | Attending: Podiatry | Admitting: Podiatry

## 2015-10-06 ENCOUNTER — Ambulatory Visit (INDEPENDENT_AMBULATORY_CARE_PROVIDER_SITE_OTHER): Payer: Self-pay | Admitting: Podiatry

## 2015-10-06 DIAGNOSIS — M795 Residual foreign body in soft tissue: Secondary | ICD-10-CM | POA: Diagnosis not present

## 2015-10-06 DIAGNOSIS — M79671 Pain in right foot: Secondary | ICD-10-CM | POA: Diagnosis not present

## 2015-10-06 DIAGNOSIS — Q828 Other specified congenital malformations of skin: Secondary | ICD-10-CM

## 2015-10-06 NOTE — Progress Notes (Signed)
   Subjective:    Patient ID: Michelle Evans, female    DOB: [birth year], 40 y.o.   MRN: [id number]  HPI  -year-old female presents the also concerns of pain to the right heel on the bottom. This been ongoing since last summer. She states that she is at the beach and she may have stepped on a foreign object. She does not recall seen and opted she did not taking out of her foot. She states that she is a callus over the area for which she tries to trim. No swelling or redness. No drainage or pus. No other complaints.   Review of Systems  All other systems reviewed and are negative.      Objective:   Physical Exam General: AAO x3, NAD  Dermatological: Punctate annular hyperkeratotic lesion right plantar heel. Upon debridement there appeared to be deep hyperkeratotic tissue. There is no evidence of verruca. No evidence of foreign body identified. No drainage or pus. No open lesions or pre-ulcerative lesions.  Vascular: Dorsalis Pedis artery and Posterior Tibial artery pedal pulses are 2/4 bilateral with immedate capillary fill time. Pedal hair growth present. No varicosities and no lower extremity edema present bilateral. There is no pain with calf compression, swelling, warmth, erythema.   Neruologic: Grossly intact via light touch bilateral. Vibratory intact via tuning fork bilateral. Protective threshold with Semmes Wienstein monofilament intact to all pedal sites bilateral. Patellar and Achilles deep tendon reflexes 2+ bilateral. No Babinski or clonus noted bilateral.   Musculoskeletal: No gross boney pedal deformities bilateral. No pain, crepitus, or limitation noted with foot and ankle range of motion bilateral. Muscular strength 5/5 in all groups tested bilateral.  Gait: Unassisted, Nonantalgic.      Assessment & Plan:  40 year old female right heel porokeratosis -Treatment options discussed including all alternatives, risks, and complications -Ordered and reviewed. No evidence of  foreign body -Hyperkeratotic lesion was debrided. A small modest bleeding did occur. The areas clean and about equivalent was applied followed by dressing. Continue daily dressing changes. Foot and has her dispensed. If symptoms continue will likely apply salicylic acid. -Monitor for any clinical signs or symptoms of infection and directed to call the office immediately should any occur or go to the ER.  Celesta Gentile, DPM -

## 2015-10-08 MED FILL — NOW PROBIOTIC-10: 50 days supply | Qty: 100 | Fill #0

## 2015-10-19 MED FILL — CYANOCOBALAMIN 1,000 MCG/ML: 1000 | 90 days supply | Qty: 3 | Fill #3

## 2015-10-19 MED FILL — MOMETASONE FUROATE 50 MCG S: 50 | 90 days supply | Qty: 51 | Fill #0

## 2015-11-11 MED FILL — VYVANSE 50 MG CAPSULE: 50 | 30 days supply | Qty: 30 | Fill #0

## 2015-11-23 MED FILL — NOW PROBIOTIC-10: 50 days supply | Qty: 100 | Fill #1

## 2015-11-23 MED FILL — MUCINEX ER 600 MG TABLET: 600 | 10 days supply | Qty: 40 | Fill #2

## 2015-12-16 MED FILL — VYVANSE 50 MG CAPSULE: 50 | 30 days supply | Qty: 30 | Fill #0

## 2016-01-12 MED FILL — SUDOGEST 30 MG TABLET: 30 | 30 days supply | Qty: 120 | Fill #0

## 2016-01-14 MED FILL — CYANOCOBALAMIN 1,000 MCG/ML: 1000 | 90 days supply | Qty: 3 | Fill #4

## 2016-01-14 MED FILL — MONTELUKAST SOD 10 MG TAB: 10 | 90 days supply | Qty: 90 | Fill #1

## 2016-02-09 MED FILL — LINZESS 145 MCG CAPSULE: 145 | 90 days supply | Qty: 90 | Fill #0

## 2016-04-20 MED FILL — CYANOCOBALAMIN 1,000 MCG/ML: 1000 | 90 days supply | Qty: 3 | Fill #0

## 2016-04-21 MED FILL — NOW PROBIOTIC-10: 50 days supply | Qty: 100 | Fill #2

## 2016-04-25 ENCOUNTER — Ambulatory Visit (HOSPITAL_BASED_OUTPATIENT_CLINIC_OR_DEPARTMENT_OTHER)
Admission: RE | Admit: 2016-04-25 | Discharge: 2016-04-25 | Disposition: A | Discharge status: Home / Self Care | Payer: 59 | Source: Ambulatory Visit | Attending: Family Medicine | Admitting: Family Medicine

## 2016-04-25 ENCOUNTER — Other Ambulatory Visit (HOSPITAL_BASED_OUTPATIENT_CLINIC_OR_DEPARTMENT_OTHER): Payer: Self-pay | Admitting: Family Medicine

## 2016-04-25 DIAGNOSIS — Z1231 Encounter for screening mammogram for malignant neoplasm of breast: Secondary | ICD-10-CM

## 2016-05-05 DIAGNOSIS — B360 Pityriasis versicolor: Secondary | ICD-10-CM | POA: Diagnosis not present

## 2016-05-05 DIAGNOSIS — F988 Other specified behavioral and emotional disorders with onset usually occurring in childhood and adolescence: Secondary | ICD-10-CM | POA: Diagnosis not present

## 2016-05-05 MED FILL — KETOCONAZOLE 2% CREAM: 2 | 30 days supply | Qty: 60 | Fill #0

## 2016-05-08 MED FILL — VYVANSE 70 MG CAPSULE: 70 | 30 days supply | Qty: 30 | Fill #0

## 2016-05-10 MED FILL — FLUCONAZOLE 200 MG TABLET: 200 | 7 days supply | Qty: 4 | Fill #0

## 2016-05-10 MED FILL — PREVIDENT 5000 BOOSTER PLUS: 1.1 | 30 days supply | Qty: 100 | Fill #0

## 2016-06-07 MED FILL — VYVANSE 70 MG CAPSULE: 70 | 30 days supply | Qty: 30 | Fill #0

## 2016-06-20 MED FILL — MOMETASONE FUROATE 50 MCG S: 50 | 90 days supply | Qty: 51 | Fill #1

## 2016-06-21 MED FILL — SUDOGEST 30 MG TABLET: 30 | 30 days supply | Qty: 120 | Fill #1

## 2016-06-21 MED FILL — MUCINEX ER 600 MG TABLET: 600 | 10 days supply | Qty: 40 | Fill #3

## 2016-07-04 ENCOUNTER — Telehealth: Payer: 59 | Admitting: Family

## 2016-07-04 DIAGNOSIS — B9689 Other specified bacterial agents as the cause of diseases classified elsewhere: Secondary | ICD-10-CM | POA: Diagnosis not present

## 2016-07-04 DIAGNOSIS — J028 Acute pharyngitis due to other specified organisms: Secondary | ICD-10-CM

## 2016-07-04 MED ORDER — AZITHROMYCIN 250 MG PO TABS: ORAL_TABLET | ORAL | 0 refills | Status: DC

## 2016-07-04 MED ORDER — BENZONATATE 100 MG PO CAPS
100.0000 mg | ORAL_CAPSULE | Freq: Three times a day (TID) | ORAL | 0 refills | Status: DC | PRN
Start: 2016-07-04 — End: 2019-10-28

## 2016-07-04 MED FILL — AZITHROMYCIN 250 MG TABLET: 250 | 5 days supply | Qty: 6 | Fill #0

## 2016-07-04 NOTE — Progress Notes (Signed)
We are sorry that you are not feeling well.  Here is how we plan to help!  Based on what you have shared with me it looks like you have upper respiratory tract inflammation that has resulted in a significant cough.  Inflammation and infection in the upper respiratory tract is commonly called bronchitis and has four common causes:  Allergies, Viral Infections, Acid Reflux and Bacterial Infections.  Allergies, viruses and acid reflux are treated by controlling symptoms or eliminating the cause. An example might be a cough caused by taking certain blood pressure medications. You stop the cough by changing the medication. Another example might be a cough caused by acid reflux. Controlling the reflux helps control the cough.  Based on your presentation I believe you most likely have A cough due to bacteria.  When patients have a fever and a productive cough with a change in color or increased sputum production, we are concerned about bacterial bronchitis.  If left untreated it can progress to pneumonia.  If your symptoms do not improve with your treatment plan it is important that you contact your provider.   I have prescribed Azithromyin 250 mg: two tables now and then one tablet daily for 4 additonal days    In addition you may use A non-prescription cough medication called Mucinex DM: take 2 tablets every 12 hours. and A prescription cough medication called Tessalon Perles 100mg. You may take 1-2 capsules every 8 hours as needed for your cough.   USE OF BRONCHODILATOR ("RESCUE") INHALERS: There is a risk from using your bronchodilator too frequently.  The risk is that over-reliance on a medication which only relaxes the muscles surrounding the breathing tubes can reduce the effectiveness of medications prescribed to reduce swelling and congestion of the tubes themselves.  Although you feel brief relief from the bronchodilator inhaler, your asthma may actually be worsening with the tubes becoming more swollen  and filled with mucus.  This can delay other crucial treatments, such as oral steroid medications. If you need to use a bronchodilator inhaler daily, several times per day, you should discuss this with your provider.  There are probably better treatments that could be used to keep your asthma under control.     HOME CARE . Only take medications as instructed by your medical team. . Complete the entire course of an antibiotic. . Drink plenty of fluids and get plenty of rest. . Avoid close contacts especially the very [place] and the elderly . Cover your mouth if you cough or cough into your sleeve. . Always remember to wash your hands . A steam or ultrasonic humidifier can help congestion.   GET HELP RIGHT AWAY IF: . You develop worsening fever. . You become short of breath . You cough up blood. . Your symptoms persist after you have completed your treatment plan MAKE SURE YOU   Understand these instructions.  Will watch your condition.  Will get help right away if you are not doing well or get worse.  Your e-visit answers were reviewed by a board certified advanced clinical practitioner to complete your personal care plan.  Depending on the condition, your plan could have included both over the counter or prescription medications. If there is a problem please reply  once you have received a response from your provider. Your safety is important to us.  If you have drug allergies check your prescription carefully.    You can use MyChart to ask questions about today's visit, request a non-urgent   call back, or ask for a work or school excuse for 24 hours related to this e-Visit. If it has been greater than 24 hours you will need to follow up with your provider, or enter a new e-Visit to address those concerns. You will get an e-mail in the next two days asking about your experience.  I hope that your e-visit has been valuable and will speed your recovery. Thank you for using e-visits.   

## 2016-07-11 MED FILL — CYANOCOBALAMIN 1,000 MCG/ML: 1000 | 90 days supply | Qty: 3 | Fill #1

## 2016-07-27 MED FILL — SUDOGEST 30 MG TABLET: 30 | 30 days supply | Qty: 120 | Fill #2

## 2016-07-27 MED FILL — MUCINEX ER 600 MG TABLET: 600 | 10 days supply | Qty: 40 | Fill #4

## 2016-07-27 MED FILL — NOW PROBIOTIC-10: 50 days supply | Qty: 100 | Fill #3

## 2016-07-27 MED FILL — PREVIDENT 5000 BOOSTER PLUS: 1.1 | 30 days supply | Qty: 100 | Fill #1

## 2016-07-27 MED FILL — VYVANSE 70 MG CAPSULE: 70 | 30 days supply | Qty: 30 | Fill #0

## 2016-08-15 MED FILL — ADDERALL XR 30 MG CAP SA: 30 | 30 days supply | Qty: 30 | Fill #0

## 2016-08-28 MED FILL — SUDOGEST 30 MG TABLET: 30 | 30 days supply | Qty: 120 | Fill #3

## 2016-10-02 MED FILL — CYANOCOBALAMIN 1,000 MCG/ML: 1000 | 84 days supply | Qty: 3 | Fill #2

## 2016-10-12 DIAGNOSIS — D649 Anemia, unspecified: Secondary | ICD-10-CM | POA: Diagnosis not present

## 2016-10-12 DIAGNOSIS — Z0000 Encounter for general adult medical examination without abnormal findings: Secondary | ICD-10-CM | POA: Diagnosis not present

## 2016-10-17 DIAGNOSIS — E559 Vitamin D deficiency, unspecified: Secondary | ICD-10-CM | POA: Diagnosis not present

## 2016-10-17 DIAGNOSIS — Z0000 Encounter for general adult medical examination without abnormal findings: Secondary | ICD-10-CM | POA: Diagnosis not present

## 2016-10-17 DIAGNOSIS — Z01419 Encounter for gynecological examination (general) (routine) without abnormal findings: Secondary | ICD-10-CM | POA: Diagnosis not present

## 2016-10-17 DIAGNOSIS — N760 Acute vaginitis: Secondary | ICD-10-CM | POA: Diagnosis not present

## 2016-10-17 DIAGNOSIS — B9689 Other specified bacterial agents as the cause of diseases classified elsewhere: Secondary | ICD-10-CM | POA: Diagnosis not present

## 2016-10-17 DIAGNOSIS — Z124 Encounter for screening for malignant neoplasm of cervix: Secondary | ICD-10-CM | POA: Diagnosis not present

## 2016-10-17 MED FILL — VIT D2 1.25 MG (50,000 UNIT: 1.25 MG | 84 days supply | Qty: 24 | Fill #0

## 2016-11-22 MED FILL — MOMETASONE FUROATE 50 MCG S: 50 | 90 days supply | Qty: 51 | Fill #0

## 2017-01-19 ENCOUNTER — Telehealth: Payer: Self-pay | Admitting: Podiatry

## 2017-01-19 MED FILL — CYANOCOBALAMIN 1,000 MCG/ML: 1000 | 84 days supply | Qty: 3 | Fill #3

## 2017-02-05 DIAGNOSIS — K5900 Constipation, unspecified: Secondary | ICD-10-CM | POA: Diagnosis not present

## 2017-02-05 DIAGNOSIS — F988 Other specified behavioral and emotional disorders with onset usually occurring in childhood and adolescence: Secondary | ICD-10-CM | POA: Diagnosis not present

## 2017-02-05 DIAGNOSIS — R21 Rash and other nonspecific skin eruption: Secondary | ICD-10-CM | POA: Diagnosis not present

## 2017-02-05 DIAGNOSIS — J4530 Mild persistent asthma, uncomplicated: Secondary | ICD-10-CM | POA: Diagnosis not present

## 2017-02-05 DIAGNOSIS — R5383 Other fatigue: Secondary | ICD-10-CM | POA: Diagnosis not present

## 2017-02-05 MED FILL — LINZESS 145 MCG CAPSULE: 145 | 90 days supply | Qty: 90 | Fill #0

## 2017-02-07 MED FILL — VYVANSE 70 MG CAPSULE: 70 | 30 days supply | Qty: 30 | Fill #0

## 2017-03-14 MED FILL — VYVANSE 70 MG CAPSULE: 70 | 30 days supply | Qty: 30 | Fill #0

## 2017-04-10 ENCOUNTER — Telehealth: Payer: 59 | Admitting: Family

## 2017-04-10 DIAGNOSIS — B308 Other viral conjunctivitis: Secondary | ICD-10-CM

## 2017-04-10 NOTE — Progress Notes (Signed)
We are sorry that you are not feeling well.  Here is how we plan to help!  Based on what you have shared with me it looks like you have conjunctivitis.  Conjunctivitis is a common inflammatory or infectious condition of the eye that is often referred to as "pink eye".  In most cases it is contagious (viral or bacterial). However, not all conjunctivitis requires antibiotics (ex. Allergic).  We have made appropriate suggestions for you based upon your presentation.  I recommend that you use OpconA, 1-2 drops every 4-6 hours (an over the counter allergy drop available at your local pharmacy).  Your pharmacist may have an alternative suggestion.  Pink eye can be highly contagious.  It is typically spread through direct contact with secretions, or contaminated objects or surfaces that one may have touched.  Strict handwashing is suggested with soap and water is urged.  If not available, use alcohol based had sanitizer.  Avoid unnecessary touching of the eye.  If you wear contact lenses, you will need to refrain from wearing them until you see no white discharge from the eye for at least 24 hours after being on medication.  You should see symptom improvement in 1-2 days after starting the medication regimen.  Call us if symptoms are not improved in 1-2 days.  Home Care:  Wash your hands often!  Do not wear your contacts until you complete your treatment plan.  Avoid sharing towels, bed linen, personal items with a person who has pink eye.  See attention for anyone in your home with similar symptoms.  Get Help Right Away If:  Your symptoms do not improve.  You develop blurred or loss of vision.  Your symptoms worsen (increased discharge, pain or redness)  Your e-visit answers were reviewed by a board certified advanced clinical practitioner to complete your personal care plan.  Depending on the condition, your plan could have included both over the counter or prescription medications.  If there  is a problem please reply  once you have received a response from your provider.  Your safety is important to us.  If you have drug allergies check your prescription carefully.    You can use MyChart to ask questions about today's visit, request a non-urgent call back, or ask for a work or school excuse for 24 hours related to this e-Visit. If it has been greater than 24 hours you will need to follow up with your provider, or enter a new e-Visit to address those concerns.   You will get an e-mail in the next two days asking about your experience.  I hope that your e-visit has been valuable and will speed your recovery. Thank you for using e-visits.     

## 2017-04-17 MED FILL — CYANOCOBALAMIN 1,000 MCG/ML: 1000 | 84 days supply | Qty: 3 | Fill #4

## 2017-04-17 MED FILL — VYVANSE 70 MG CAPSULE: 70 | 30 days supply | Qty: 30 | Fill #0

## 2017-04-24 MED FILL — EUCRISA 2% OINTMENT: 2 | 30 days supply | Qty: 60 | Fill #0

## 2017-06-18 MED FILL — metroNIDAZOLE 0.75 % GEL: 0.75 | 7 days supply | Qty: 70 | Fill #0

## 2017-06-18 MED FILL — EUCRISA 2% OINTMENT: 2 | 30 days supply | Qty: 60 | Fill #1

## 2017-06-25 MED FILL — metroNIDAZOLE 0.75 % GEL: 0.75 | 7 days supply | Qty: 70 | Fill #1

## 2017-07-23 MED FILL — MONTELUKAST SOD 10 MG TAB: 10 | 90 days supply | Qty: 90 | Fill #0

## 2017-07-23 MED FILL — MOMETASONE FUROATE 50 MCG S: 50 | 90 days supply | Qty: 51 | Fill #1

## 2017-07-23 MED FILL — LINZESS 145 MCG CAPSULE: 145 | 90 days supply | Qty: 90 | Fill #1

## 2017-07-23 MED FILL — CLOBETASOL 0.05% OINTMENT: 0.05 | 30 days supply | Qty: 60 | Fill #0

## 2017-07-23 MED FILL — CYANOCOBALAMIN 1,000 MCG/ML: 1000 | 84 days supply | Qty: 3 | Fill #0

## 2017-08-09 DIAGNOSIS — R030 Elevated blood-pressure reading, without diagnosis of hypertension: Secondary | ICD-10-CM | POA: Diagnosis not present

## 2017-08-09 DIAGNOSIS — F988 Other specified behavioral and emotional disorders with onset usually occurring in childhood and adolescence: Secondary | ICD-10-CM | POA: Diagnosis not present

## 2017-08-10 MED FILL — NOW PROBIOTIC-10: 25 days supply | Qty: 100 | Fill #0

## 2017-08-10 MED FILL — VYVANSE 70 MG CAPSULE: 70 | 30 days supply | Qty: 30 | Fill #0

## 2017-08-17 DIAGNOSIS — F988 Other specified behavioral and emotional disorders with onset usually occurring in childhood and adolescence: Secondary | ICD-10-CM | POA: Insufficient documentation

## 2017-09-24 MED FILL — VYVANSE 70 MG CAPSULE: 70 | 30 days supply | Qty: 30 | Fill #0

## 2017-10-11 MED FILL — CYANOCOBALAMIN 1,000 MCG/ML: 1000 | 84 days supply | Qty: 3 | Fill #1

## 2017-10-11 MED FILL — CLOBETASOL PROPIONATE 0.05: 0.05 | 30 days supply | Qty: 60 | Fill #1

## 2017-10-23 ENCOUNTER — Other Ambulatory Visit (HOSPITAL_BASED_OUTPATIENT_CLINIC_OR_DEPARTMENT_OTHER): Payer: Self-pay | Admitting: Family Medicine

## 2017-10-23 ENCOUNTER — Other Ambulatory Visit: Payer: Self-pay | Admitting: Obstetrics & Gynecology

## 2017-10-23 DIAGNOSIS — Z1239 Encounter for other screening for malignant neoplasm of breast: Secondary | ICD-10-CM

## 2017-10-26 ENCOUNTER — Ambulatory Visit (HOSPITAL_BASED_OUTPATIENT_CLINIC_OR_DEPARTMENT_OTHER)
Admission: RE | Admit: 2017-10-26 | Discharge: 2017-10-26 | Disposition: A | Discharge status: Home / Self Care | Payer: 59 | Source: Ambulatory Visit | Attending: Family Medicine | Admitting: Family Medicine

## 2017-10-26 DIAGNOSIS — Z1239 Encounter for other screening for malignant neoplasm of breast: Secondary | ICD-10-CM

## 2017-10-26 DIAGNOSIS — Z1231 Encounter for screening mammogram for malignant neoplasm of breast: Secondary | ICD-10-CM | POA: Diagnosis not present

## 2017-11-07 MED FILL — VYVANSE 70 MG CAPSULE: 70 | 30 days supply | Qty: 30 | Fill #0

## 2017-11-16 MED FILL — CLOBETASOL PROPIONATE 0.05: 0.05 | 30 days supply | Qty: 60 | Fill #2

## 2017-11-16 MED FILL — NOW PROBIOTIC-10: 25 days supply | Qty: 100 | Fill #1

## 2017-12-04 DIAGNOSIS — F988 Other specified behavioral and emotional disorders with onset usually occurring in childhood and adolescence: Secondary | ICD-10-CM | POA: Diagnosis not present

## 2017-12-04 DIAGNOSIS — F439 Reaction to severe stress, unspecified: Secondary | ICD-10-CM | POA: Diagnosis not present

## 2017-12-04 DIAGNOSIS — L299 Pruritus, unspecified: Secondary | ICD-10-CM | POA: Diagnosis not present

## 2017-12-05 MED FILL — hydrOXYzine HCL 25 MG TABS: 25 | 30 days supply | Qty: 90 | Fill #0

## 2017-12-05 MED FILL — CMPD TRIAMCINOLONE/CETA 1:1: 0.1% | 30 days supply | Qty: 440 | Fill #0

## 2017-12-07 MED FILL — VYVANSE 70 MG CAPSULE: 70 | 30 days supply | Qty: 30 | Fill #0

## 2017-12-28 MED FILL — NOW PROBIOTIC-10: 25 days supply | Qty: 100 | Fill #2

## 2018-01-21 MED FILL — hydrOXYzine HCL 25 MG TABS: 25 | 30 days supply | Qty: 90 | Fill #1

## 2018-01-28 MED FILL — VYVANSE 70 MG CAPSULE: 70 | 30 days supply | Qty: 30 | Fill #0

## 2018-01-29 MED FILL — MONTELUKAST SOD 10 MG TAB: 10 | 90 days supply | Qty: 90 | Fill #1

## 2018-02-14 MED FILL — hydrOXYzine HCL 25 MG TABS: 25 | 30 days supply | Qty: 90 | Fill #2

## 2018-03-20 MED FILL — VYVANSE 70 MG CAPSULE: 70 | 30 days supply | Qty: 30 | Fill #0

## 2018-03-29 MED FILL — hydrOXYzine HCL 25 MG TABS: 25 | 30 days supply | Qty: 90 | Fill #3

## 2018-04-09 MED FILL — CYANOCOBALAMIN 1,000 MCG/ML: 1000 | 84 days supply | Qty: 3 | Fill #0

## 2018-05-06 MED FILL — CLOBETASOL PROPIONATE 0.05: 0.05 | 20 days supply | Qty: 60 | Fill #0

## 2018-05-21 MED FILL — MOMETASONE FUROATE 50 MCG S: 50 | 90 days supply | Qty: 51 | Fill #0

## 2018-07-02 MED FILL — CYANOCOBALAMIN 1,000 MCG/ML: 1000 | 84 days supply | Qty: 3 | Fill #1

## 2018-07-02 MED FILL — NOW PROBIOTIC-10: 25 days supply | Qty: 100 | Fill #3

## 2018-08-12 DIAGNOSIS — H52223 Regular astigmatism, bilateral: Secondary | ICD-10-CM | POA: Diagnosis not present

## 2018-08-12 DIAGNOSIS — H5213 Myopia, bilateral: Secondary | ICD-10-CM | POA: Diagnosis not present

## 2018-10-21 ENCOUNTER — Telehealth: Payer: 59 | Admitting: Family

## 2018-10-21 DIAGNOSIS — A499 Bacterial infection, unspecified: Secondary | ICD-10-CM | POA: Diagnosis not present

## 2018-10-21 DIAGNOSIS — N390 Urinary tract infection, site not specified: Secondary | ICD-10-CM

## 2018-10-21 MED ORDER — NITROFURANTOIN MONOHYD MACRO 100 MG PO CAPS: 100.0000 mg | ORAL_CAPSULE | Freq: Two times a day (BID) | ORAL | 0 refills | Status: DC

## 2018-10-21 MED FILL — NITROFURANTOIN MONO-MCR 100: 100 | 5 days supply | Qty: 10 | Fill #0

## 2018-10-21 NOTE — Progress Notes (Signed)

## 2018-10-25 ENCOUNTER — Ambulatory Visit: Payer: 59 | Admitting: Family Medicine

## 2018-10-31 DIAGNOSIS — R030 Elevated blood-pressure reading, without diagnosis of hypertension: Secondary | ICD-10-CM | POA: Diagnosis not present

## 2018-10-31 DIAGNOSIS — Z01419 Encounter for gynecological examination (general) (routine) without abnormal findings: Secondary | ICD-10-CM | POA: Diagnosis not present

## 2018-10-31 DIAGNOSIS — E6609 Other obesity due to excess calories: Secondary | ICD-10-CM | POA: Diagnosis not present

## 2018-10-31 DIAGNOSIS — J4520 Mild intermittent asthma, uncomplicated: Secondary | ICD-10-CM | POA: Diagnosis not present

## 2018-10-31 DIAGNOSIS — J309 Allergic rhinitis, unspecified: Secondary | ICD-10-CM | POA: Diagnosis not present

## 2018-10-31 DIAGNOSIS — Z23 Encounter for immunization: Secondary | ICD-10-CM | POA: Diagnosis not present

## 2018-10-31 DIAGNOSIS — R5383 Other fatigue: Secondary | ICD-10-CM | POA: Diagnosis not present

## 2018-10-31 DIAGNOSIS — K5900 Constipation, unspecified: Secondary | ICD-10-CM | POA: Diagnosis not present

## 2018-10-31 DIAGNOSIS — F988 Other specified behavioral and emotional disorders with onset usually occurring in childhood and adolescence: Secondary | ICD-10-CM | POA: Diagnosis not present

## 2018-11-05 MED FILL — VYVANSE 70 MG CAPSULE: 70 | 30 days supply | Qty: 30 | Fill #0

## 2018-11-20 MED FILL — PHENTERMINE 37.5 MG TABLET: 37.5 | 30 days supply | Qty: 30 | Fill #0

## 2018-11-27 MED FILL — CYANOCOBALAMIN 1,000 MCG/ML: 1000 | 90 days supply | Qty: 3 | Fill #0

## 2018-12-02 MED FILL — SAXENDA 18 MG/3 ML PEN: 18 | 30 days supply | Qty: 15 | Fill #0

## 2018-12-11 MED FILL — VYVANSE 70 MG CAPSULE: 70 | 30 days supply | Qty: 30 | Fill #0

## 2019-01-07 MED FILL — SAXENDA 18 MG/3 ML PEN: 18 | 30 days supply | Qty: 15 | Fill #1

## 2019-02-18 MED FILL — VYVANSE 70 MG CAPSULE: 70 | 30 days supply | Qty: 30 | Fill #0

## 2019-02-18 MED FILL — SAXENDA 18 MG/3 ML PEN: 18 | 30 days supply | Qty: 15 | Fill #2

## 2019-03-03 MED FILL — CYANOCOBALAMIN 1,000 MCG/ML: 1000 | 90 days supply | Qty: 3 | Fill #1

## 2019-04-21 MED FILL — MOMETASONE FUROATE 50 MCG S: 50 | 90 days supply | Qty: 51 | Fill #0

## 2019-05-06 MED FILL — SM NICOTINE 4 MG LOZENGE: 4 | 10 days supply | Qty: 72 | Fill #0

## 2019-05-06 MED FILL — NICOTINE 21 MG/24HR PATCH: 21 | 28 days supply | Qty: 28 | Fill #0

## 2019-05-15 MED FILL — FLUARIX QUADRIVALENT 0.5 ML: 0.5 | 1 days supply | Qty: 1 | Fill #0

## 2019-05-27 MED FILL — NICOTINE 21 MG/24HR PATCH: 21 | 28 days supply | Qty: 28 | Fill #1

## 2019-05-27 MED FILL — SM NICOTINE 4 MG CHEWING GU: 4 | 7 days supply | Qty: 50 | Fill #0

## 2019-06-05 MED FILL — CYANOCOBALAMIN 1,000 MCG/ML: 1000 | 90 days supply | Qty: 3 | Fill #2

## 2019-06-05 MED FILL — LINZESS 145 MCG CAPSULE: 145 | 90 days supply | Qty: 90 | Fill #0

## 2019-06-05 MED FILL — ALBUTEROL SULFATE HFA 108 (: 108 (90 BAS | 25 days supply | Qty: 18 | Fill #0

## 2019-08-18 DIAGNOSIS — H5213 Myopia, bilateral: Secondary | ICD-10-CM | POA: Diagnosis not present

## 2019-08-18 DIAGNOSIS — H52223 Regular astigmatism, bilateral: Secondary | ICD-10-CM | POA: Diagnosis not present

## 2019-09-15 MED FILL — MONTELUKAST SOD 10 MG TAB: 10 | 90 days supply | Qty: 90 | Fill #0

## 2019-09-29 MED FILL — MOMETASONE FUROATE 50 MCG S: 50 | 90 days supply | Qty: 51 | Fill #1

## 2019-09-29 MED FILL — ALBUTEROL SULFATE HFA 108 (: 108 (90 BAS | 25 days supply | Qty: 18 | Fill #1

## 2019-09-29 MED FILL — CYANOCOBALAMIN 1,000 MCG/ML: 1000 | 90 days supply | Qty: 3 | Fill #3

## 2019-10-28 ENCOUNTER — Emergency Department
Admission: EM | Admit: 2019-10-28 | Discharge: 2019-10-28 | Disposition: A | Discharge status: Home / Self Care | Payer: 59 | Source: Home / Self Care | Attending: Family Medicine | Admitting: Family Medicine

## 2019-10-28 ENCOUNTER — Encounter: Payer: Self-pay | Admitting: Emergency Medicine

## 2019-10-28 ENCOUNTER — Other Ambulatory Visit: Payer: Self-pay

## 2019-10-28 DIAGNOSIS — J209 Acute bronchitis, unspecified: Secondary | ICD-10-CM

## 2019-10-28 DIAGNOSIS — J069 Acute upper respiratory infection, unspecified: Secondary | ICD-10-CM

## 2019-10-28 MED ORDER — METHYLPREDNISOLONE SODIUM SUCC 125 MG IJ SOLR
80.0000 mg | Freq: Once | INTRAMUSCULAR | Status: AC
  Administered 2019-10-28: 80 mg via INTRAMUSCULAR

## 2019-10-28 MED ORDER — PREDNISONE 20 MG PO TABS: ORAL_TABLET | ORAL | 0 refills | Status: DC

## 2019-10-28 MED ORDER — AZITHROMYCIN 250 MG PO TABS: ORAL_TABLET | ORAL | 0 refills | Status: DC

## 2019-10-28 NOTE — Discharge Instructions (Addendum)
Begin prednisone Wednesday 10/29/19. Use albuterol inhaler as needed. Take plain guaifenesin (1200mg  extended release tabs such as Mucinex) twice daily, with plenty of water, for cough and congestion.   May take Delsym Cough Suppressant at bedtime for nighttime cough.  Try warm salt water gargles for sore throat.  Stop all antihistamines for now, and other non-prescription cough/cold preparations.

## 2019-10-28 NOTE — ED Provider Notes (Signed)
[facility]    CSN: [id number] Arrival date & time: 10/28/19  1831      History   Chief Complaint Chief Complaint  Patient presents with  . Cough    HPI Michelle Evans is a 44 y.o. female.   About 10 days ago patient developed a brief mild sore throat and non-productive cough.  The cough has persisted.  She feels tightness in her anterior chest (but no pleuritic pain), and has developed wheezing improved with her albuterol inhaler.   She denies shortness of breath and changes in taste/smell.  She has a family history of asthma.    The history is provided by the patient.    Past Medical History:  Diagnosis Date  . Abnormal Pap smear    18 yrs ago  cone biopsy  . Acute upper respiratory infections of other multiple sites   . ADD (attention deficit disorder)   . Anemia   . Dysmenorrhea   . Fibroid tumor   . Wheezing     Patient Active Problem List   Diagnosis Date Noted  . Candidiasis of female genitalia 09/18/2013  . Nasal congestion 09/18/2013  . Sinus infection 09/18/2013  . Cough 09/18/2013  . Other malaise and fatigue 02/24/2013  . Overweight 02/24/2013  . Tobacco use disorder 02/24/2013  . History of cervical dysplasia 09/20/2011  . Anemia 09/20/2011  . Menorrhagia 09/20/2011    Past Surgical History:  Procedure Laterality Date  . CERVICAL CONE BIOPSY      OB History    Gravida  2   Para  2   Term  2   Preterm      AB      Living  2     SAB      TAB      Ectopic      Multiple      Live Births               Home Medications    Prior to Admission medications   Medication Sig Start Date End Date Taking? Authorizing Provider  DM-Doxylamine-Acetaminophen (NYQUIL COLD & FLU PO) Take by mouth.   Yes [provider]  guaiFENesin (MUCINEX) 600 MG 12 hr tablet Take by mouth 2 (two) times daily.   Yes [provider]  albuterol (PROVENTIL HFA;VENTOLIN HFA) 108 (90 BASE) MCG/ACT inhaler Inhale 2 puffs into  the lungs every 6 (six) hours as needed for wheezing. 01/26/14 01/27/15  Jonathon Resides, MD  azithromycin (ZITHROMAX Z-PAK) 250 MG tablet Take 2 tabs today; then begin one tab once daily for 4 more days. 10/28/19   Kandra Nicolas, MD  chlorpheniramine-HYDROcodone (TUSSIONEX PENNKINETIC ER) 10-8 MG/5ML LQCR Take 5 mLs by mouth 2 (two) times daily. 07/21/14   Fransico Meadow, PA-C  ibuprofen (ADVIL,MOTRIN) 800 MG tablet Take 1 tablet (800 mg total) by mouth every 8 (eight) hours as needed. 09/17/13   Jonathon Resides, MD  Linaclotide (LINZESS) 145 MCG CAPS capsule Take 1 capsule (145 mcg total) by mouth daily. 01/26/14 01/27/15  Jonathon Resides, MD  lisdexamfetamine (VYVANSE) 50 MG capsule Take 1 capsule (50 mg total) by mouth daily. 03/04/14 03/05/15  Zanard, Bernadene Bell, MD  mometasone (NASONEX) 50 MCG/ACT nasal spray Place 2 sprays into the nose daily. 09/17/13   Zanard, Bernadene Bell, MD  montelukast (SINGULAIR) 10 MG tablet Take 1 tablet (10 mg total) by mouth at bedtime. 01/26/14 01/27/15  Jonathon Resides, MD  predniSONE (DELTASONE) 20  MG tablet Take one tab by mouth twice daily for 4 days, then one daily for 3 days. Take with food. 10/28/19   Kandra Nicolas, MD    Family History Family History  Problem Relation Age of Onset  . Diabetes Maternal Grandmother   . Hypertension Maternal Grandmother   . Heart disease Maternal Grandmother   . Hypertension Mother   . Cancer Maternal Grandfather        colon and prostate  . CVA Maternal Grandfather     Social History Social History   Tobacco Use  . Smoking status: Current Every Day Smoker    Packs/day: 0.30    Years: 16.00    Pack years: 4.80    Types: Cigarettes  . Smokeless tobacco: Never Used  . Tobacco comment: She is trying to quit by reduce the number of cigarrettes she smokes  Substance Use Topics  . Alcohol use: Yes    Comment: socially  . Drug use: No     Allergies   Theophyllines   Review of Systems Review of Systems + sore throat +  cough No pleuritic pain + wheezing + nasal congestion + post-nasal drainage No sinus pain/pressure No itchy/red eyes No earache No hemoptysis No SOB + low grade fever, + chills No nausea No vomiting No abdominal pain No diarrhea No urinary symptoms No skin rash + fatigue No myalgias No headache Used OTC meds (Nyquil) without relief   Physical Exam Triage Vital Signs ED Triage Vitals  Enc Vitals Group     BP 10/28/19 1848 (!) 160/110     Pulse Rate 10/28/19 1848 90     Resp --      Temp 10/28/19 1848 98.1 F (36.7 C)     Temp Source 10/28/19 1848 Oral     SpO2 10/28/19 1848 100 %     Weight 10/28/19 1851 192 lb (87.1 kg)     Height 10/28/19 1851 5\' 8"  (1.727 m)     Head Circumference --      Peak Flow --      Pain Score 10/28/19 1851 0     Pain Loc --      Pain Edu? --      Excl. in [place]? --    No data found.  Updated Vital Signs BP (!) 160/110 (BP Location: Right Arm)   Pulse 90   Temp 98.1 F (36.7 C) (Oral)   Ht 5\' 8"  (1.727 m)   Wt 87.1 kg   SpO2 100%   BMI 29.19 kg/m   Visual Acuity Right Eye Distance:   Left Eye Distance:   Bilateral Distance:    Right Eye Near:   Left Eye Near:    Bilateral Near:     Physical Exam Nursing notes and Vital Signs reviewed. Appearance:  Patient appears stated age, and in no acute distress Eyes:  Pupils are equal, round, and reactive to light and accomodation.  Extraocular movement is intact.  Conjunctivae are not inflamed  Ears:  Canals normal.  Tympanic membranes normal.  Nose:  Mildly congested turbinates.  No sinus tenderness.  Pharynx:  Normal Neck:  Supple.  Mildly enlarged lateral nodes are present, tender to palpation on the left.   Lungs:   Faint scattered wheezes.  Breath sounds are equal.  Moving air well. Heart:  Regular rate and rhythm without murmurs, rubs, or gallops.  Abdomen:  Nontender without masses or hepatosplenomegaly.  Bowel sounds are present.  No CVA or flank tenderness.  Extremities:  No edema.  Skin:  No rash present.   UC Treatments / Results  Labs (all labs ordered are listed, but only abnormal results are displayed) Labs Reviewed - No data to display  EKG   Radiology No results found.  Procedures Procedures (including critical care time)  Medications Ordered in UC Medications  methylPREDNISolone sodium succinate (SOLU-MEDROL) 125 mg/2 mL injection 80 mg (has no administration in time range)    Initial Impression / Assessment and Plan / UC Course  I have reviewed the triage vital signs and the nursing notes.  Pertinent labs & imaging results that were available during my care of the patient were reviewed by me and considered in my medical decision making (see chart for details).    Administered Solumedrol 80mg  IM. Begin Z-pak and prednisone burst/taper Followup with Family Doctor if not improved in 10 days.   Final Clinical Impressions(s) / UC Diagnoses   Final diagnoses:  Viral URI with cough  Acute bronchitis with bronchospasm     Discharge Instructions     Begin prednisone Wednesday 10/29/19. Use albuterol inhaler as needed. Take plain guaifenesin (1200mg  extended release tabs such as Mucinex) twice daily, with plenty of water, for cough and congestion.   May take Delsym Cough Suppressant at bedtime for nighttime cough.  Try warm salt water gargles for sore throat.  Stop all antihistamines for now, and other non-prescription cough/cold preparations.      ED Prescriptions    Medication Sig Dispense Auth. Provider   azithromycin (ZITHROMAX Z-PAK) 250 MG tablet Take 2 tabs today; then begin one tab once daily for 4 more days. 6 tablet Kandra Nicolas, MD   predniSONE (DELTASONE) 20 MG tablet Take one tab by mouth twice daily for 4 days, then one daily for 3 days. Take with food. 11 tablet Kandra Nicolas, MD        Kandra Nicolas, MD 10/29/19 [phone]

## 2019-10-28 NOTE — ED Triage Notes (Signed)
Dry cough x10days

## 2019-10-29 MED FILL — predniSONE 20 MG TABS: 20 | 11 days supply | Qty: 11 | Fill #0

## 2019-10-29 MED FILL — AZITHROMYCIN 250 MG TABLET: 250 | 6 days supply | Qty: 6 | Fill #0

## 2019-12-30 DIAGNOSIS — Z01419 Encounter for gynecological examination (general) (routine) without abnormal findings: Secondary | ICD-10-CM | POA: Diagnosis not present

## 2020-01-06 ENCOUNTER — Other Ambulatory Visit (HOSPITAL_BASED_OUTPATIENT_CLINIC_OR_DEPARTMENT_OTHER): Payer: Self-pay | Admitting: Family Medicine

## 2020-01-06 DIAGNOSIS — F988 Other specified behavioral and emotional disorders with onset usually occurring in childhood and adolescence: Secondary | ICD-10-CM | POA: Diagnosis not present

## 2020-01-06 DIAGNOSIS — L309 Dermatitis, unspecified: Secondary | ICD-10-CM | POA: Diagnosis not present

## 2020-01-06 DIAGNOSIS — J309 Allergic rhinitis, unspecified: Secondary | ICD-10-CM | POA: Diagnosis not present

## 2020-01-06 DIAGNOSIS — J4520 Mild intermittent asthma, uncomplicated: Secondary | ICD-10-CM | POA: Diagnosis not present

## 2020-01-06 DIAGNOSIS — E6609 Other obesity due to excess calories: Secondary | ICD-10-CM | POA: Diagnosis not present

## 2020-01-06 DIAGNOSIS — K5900 Constipation, unspecified: Secondary | ICD-10-CM | POA: Diagnosis not present

## 2020-01-06 DIAGNOSIS — R5383 Other fatigue: Secondary | ICD-10-CM | POA: Diagnosis not present

## 2020-01-06 DIAGNOSIS — R030 Elevated blood-pressure reading, without diagnosis of hypertension: Secondary | ICD-10-CM | POA: Diagnosis not present

## 2020-01-06 MED FILL — HYDROCHLOROTHIAZIDE 25 MG T: 25 | 30 days supply | Qty: 30 | Fill #0

## 2020-01-06 MED FILL — MOMETASONE FUROATE 0.1% OIN: 0.1 | 30 days supply | Qty: 45 | Fill #0

## 2020-01-07 MED FILL — VYVANSE 70 MG CAPSULE: 70 | 30 days supply | Qty: 30 | Fill #0

## 2020-01-07 MED FILL — CYANOCOBALAMIN 1,000 MCG/ML: 1000 | 90 days supply | Qty: 3 | Fill #0

## 2020-01-09 MED FILL — OZEMPIC (1 MG/DOSE) 4 MG/3M: 4 | 84 days supply | Qty: 9 | Fill #0

## 2020-02-09 MED FILL — VYVANSE 70 MG CAPSULE: 70 | 30 days supply | Qty: 30 | Fill #0

## 2020-02-09 MED FILL — MOMETASONE FUROATE 0.1% OIN: 0.1 | 30 days supply | Qty: 45 | Fill #1

## 2020-02-10 ENCOUNTER — Other Ambulatory Visit (HOSPITAL_BASED_OUTPATIENT_CLINIC_OR_DEPARTMENT_OTHER): Payer: Self-pay | Admitting: Family Medicine

## 2020-02-10 MED FILL — HYDROCHLOROTHIAZIDE 25 MG T: 25 | 90 days supply | Qty: 90 | Fill #0

## 2020-03-15 MED FILL — MOMETASONE FUROATE 0.1% OIN: 0.1 | 30 days supply | Qty: 45 | Fill #2

## 2020-03-15 MED FILL — ALBUTEROL SULFATE HFA 108 (: 108 (90 BAS | 25 days supply | Qty: 18 | Fill #0

## 2020-03-15 MED FILL — VYVANSE 70 MG CAPSULE: 70 | 30 days supply | Qty: 30 | Fill #0

## 2020-04-12 ENCOUNTER — Ambulatory Visit: Payer: 59 | Attending: Internal Medicine

## 2020-04-12 DIAGNOSIS — Z23 Encounter for immunization: Secondary | ICD-10-CM

## 2020-04-12 MED FILL — OZEMPIC (1 MG/DOSE) 4 MG/3M: 4 | 84 days supply | Qty: 9 | Fill #1

## 2020-04-12 MED FILL — CYANOCOBALAMIN 1,000 MCG/ML: 1000 | 90 days supply | Qty: 3 | Fill #1

## 2020-04-12 MED FILL — MOMETASONE FUROATE 0.1% OIN: 0.1 | 30 days supply | Qty: 45 | Fill #3

## 2020-04-16 MED FILL — MODERNA COVID-19 VACCINE 10: 100 | 1 days supply | Qty: 1 | Fill #0

## 2020-05-18 MED FILL — ALBUTEROL SULFATE HFA 108 (: 108 (90 BAS | 25 days supply | Qty: 18 | Fill #1

## 2020-05-18 MED FILL — HYDROCHLOROTHIAZIDE 25 MG T: 25 | 90 days supply | Qty: 90 | Fill #1

## 2020-05-18 MED FILL — MOMETASONE FUROATE 0.1% OIN: 0.1 | 30 days supply | Qty: 45 | Fill #4

## 2020-05-24 ENCOUNTER — Other Ambulatory Visit (HOSPITAL_BASED_OUTPATIENT_CLINIC_OR_DEPARTMENT_OTHER): Payer: Self-pay | Admitting: Internal Medicine

## 2020-05-24 MED FILL — FLUARIX QUADRIVALENT 0.5 ML: 0.5 | 1 days supply | Qty: 1 | Fill #0

## 2020-06-22 MED FILL — ALBUTEROL SULFATE HFA 108 (: 108 (90 BAS | 25 days supply | Qty: 18 | Fill #2

## 2020-06-22 MED FILL — MOMETASONE FUROATE 0.1% OIN: 0.1 | 30 days supply | Qty: 45 | Fill #5

## 2020-07-12 MED FILL — LINZESS 145 MCG CAPSULE: 145 | 90 days supply | Qty: 90 | Fill #0

## 2020-08-09 MED FILL — CYANOCOBALAMIN 1,000 MCG/ML: 1000 | 90 days supply | Qty: 3 | Fill #2

## 2020-08-19 ENCOUNTER — Other Ambulatory Visit (HOSPITAL_BASED_OUTPATIENT_CLINIC_OR_DEPARTMENT_OTHER): Payer: Self-pay | Admitting: Family Medicine

## 2020-08-19 MED FILL — OZEMPIC (1 MG/DOSE) 4 MG/3M: 4 | 84 days supply | Qty: 9 | Fill #0

## 2020-08-23 DIAGNOSIS — H5213 Myopia, bilateral: Secondary | ICD-10-CM | POA: Diagnosis not present

## 2020-08-23 DIAGNOSIS — H52223 Regular astigmatism, bilateral: Secondary | ICD-10-CM | POA: Diagnosis not present

## 2020-09-09 DIAGNOSIS — Z20822 Contact with and (suspected) exposure to covid-19: Secondary | ICD-10-CM | POA: Diagnosis not present

## 2020-09-09 DIAGNOSIS — Z03818 Encounter for observation for suspected exposure to other biological agents ruled out: Secondary | ICD-10-CM | POA: Diagnosis not present

## 2020-09-20 ENCOUNTER — Other Ambulatory Visit (HOSPITAL_COMMUNITY): Payer: Self-pay | Admitting: Pharmacist

## 2020-09-20 MED FILL — CARESTART COVID-19 HOME TES: 2 days supply | Qty: 2 | Fill #0

## 2020-10-01 MED FILL — HYDROCHLOROTHIAZIDE 25 MG T: 25 | 90 days supply | Qty: 90 | Fill #2

## 2020-10-07 ENCOUNTER — Other Ambulatory Visit (HOSPITAL_BASED_OUTPATIENT_CLINIC_OR_DEPARTMENT_OTHER): Payer: Self-pay | Admitting: Family Medicine

## 2020-10-07 DIAGNOSIS — K5900 Constipation, unspecified: Secondary | ICD-10-CM | POA: Diagnosis not present

## 2020-10-07 DIAGNOSIS — R030 Elevated blood-pressure reading, without diagnosis of hypertension: Secondary | ICD-10-CM | POA: Diagnosis not present

## 2020-10-07 DIAGNOSIS — R5383 Other fatigue: Secondary | ICD-10-CM | POA: Diagnosis not present

## 2020-10-07 DIAGNOSIS — L309 Dermatitis, unspecified: Secondary | ICD-10-CM | POA: Diagnosis not present

## 2020-10-07 DIAGNOSIS — J309 Allergic rhinitis, unspecified: Secondary | ICD-10-CM | POA: Diagnosis not present

## 2020-10-07 DIAGNOSIS — F988 Other specified behavioral and emotional disorders with onset usually occurring in childhood and adolescence: Secondary | ICD-10-CM | POA: Diagnosis not present

## 2020-10-07 DIAGNOSIS — J4520 Mild intermittent asthma, uncomplicated: Secondary | ICD-10-CM | POA: Diagnosis not present

## 2020-10-07 DIAGNOSIS — E6609 Other obesity due to excess calories: Secondary | ICD-10-CM | POA: Diagnosis not present

## 2020-10-29 ENCOUNTER — Other Ambulatory Visit (HOSPITAL_BASED_OUTPATIENT_CLINIC_OR_DEPARTMENT_OTHER): Payer: Self-pay

## 2020-10-29 MED FILL — Linaclotide Cap 145 MCG: ORAL | 90 days supply | Qty: 90 | Fill #0 | Status: AC

## 2020-10-29 MED FILL — Cyanocobalamin Inj 1000 MCG/ML: INTRAMUSCULAR | 90 days supply | Qty: 3 | Fill #0 | Status: AC

## 2020-11-02 ENCOUNTER — Other Ambulatory Visit (HOSPITAL_BASED_OUTPATIENT_CLINIC_OR_DEPARTMENT_OTHER): Payer: Self-pay

## 2020-11-02 MED FILL — Semaglutide Soln Pen-inj 1 MG/DOSE (4 MG/3ML): SUBCUTANEOUS | 84 days supply | Qty: 9 | Fill #0 | Status: AC

## 2020-11-09 ENCOUNTER — Other Ambulatory Visit (HOSPITAL_BASED_OUTPATIENT_CLINIC_OR_DEPARTMENT_OTHER): Payer: Self-pay

## 2020-11-09 MED FILL — Mometasone Furoate Nasal Susp 50 MCG/ACT: NASAL | 90 days supply | Qty: 51 | Fill #0 | Status: CN

## 2020-11-12 ENCOUNTER — Other Ambulatory Visit (HOSPITAL_BASED_OUTPATIENT_CLINIC_OR_DEPARTMENT_OTHER): Payer: Self-pay

## 2020-11-15 ENCOUNTER — Other Ambulatory Visit (HOSPITAL_BASED_OUTPATIENT_CLINIC_OR_DEPARTMENT_OTHER): Payer: Self-pay

## 2020-11-15 MED FILL — Lisdexamfetamine Dimesylate Cap 70 MG: ORAL | 30 days supply | Qty: 30 | Fill #0 | Status: AC

## 2020-11-17 ENCOUNTER — Other Ambulatory Visit (HOSPITAL_BASED_OUTPATIENT_CLINIC_OR_DEPARTMENT_OTHER): Payer: Self-pay

## 2020-11-17 MED ORDER — FLUTICASONE PROPIONATE 50 MCG/ACT NA SUSP
NASAL | 3 refills | Status: DC
  Filled 2020-11-17: qty 48, 90d supply, fill #0
  Filled 2021-05-25: qty 48, 90d supply, fill #1
  Filled 2021-08-10: qty 48, 90d supply, fill #2

## 2020-12-27 ENCOUNTER — Other Ambulatory Visit (HOSPITAL_BASED_OUTPATIENT_CLINIC_OR_DEPARTMENT_OTHER): Payer: Self-pay

## 2020-12-27 MED FILL — Lisdexamfetamine Dimesylate Cap 70 MG: ORAL | 30 days supply | Qty: 30 | Fill #0 | Status: AC

## 2021-01-06 ENCOUNTER — Other Ambulatory Visit (HOSPITAL_BASED_OUTPATIENT_CLINIC_OR_DEPARTMENT_OTHER): Payer: Self-pay

## 2021-01-06 DIAGNOSIS — K649 Unspecified hemorrhoids: Secondary | ICD-10-CM | POA: Diagnosis not present

## 2021-01-06 DIAGNOSIS — Z6829 Body mass index (BMI) 29.0-29.9, adult: Secondary | ICD-10-CM | POA: Diagnosis not present

## 2021-01-06 DIAGNOSIS — F988 Other specified behavioral and emotional disorders with onset usually occurring in childhood and adolescence: Secondary | ICD-10-CM | POA: Diagnosis not present

## 2021-01-06 DIAGNOSIS — Z124 Encounter for screening for malignant neoplasm of cervix: Secondary | ICD-10-CM | POA: Diagnosis not present

## 2021-01-06 DIAGNOSIS — E6609 Other obesity due to excess calories: Secondary | ICD-10-CM | POA: Diagnosis not present

## 2021-01-06 DIAGNOSIS — Z01419 Encounter for gynecological examination (general) (routine) without abnormal findings: Secondary | ICD-10-CM | POA: Diagnosis not present

## 2021-01-06 LAB — HM PAP SMEAR: HM Pap smear: NORMAL

## 2021-01-06 MED ORDER — VYVANSE 70 MG PO CAPS
ORAL_CAPSULE | ORAL | 0 refills | Status: DC
  Filled 2021-04-04: qty 30, 30d supply, fill #0

## 2021-01-06 MED ORDER — OZEMPIC (1 MG/DOSE) 4 MG/3ML ~~LOC~~ SOPN
1.0000 mg | PEN_INJECTOR | SUBCUTANEOUS | 1 refills | Status: DC
  Filled 2021-01-06 – 2021-01-25 (×2): qty 9, 84d supply, fill #0
  Filled 2021-05-12: qty 9, 84d supply, fill #1

## 2021-01-06 MED ORDER — HYDROCORTISONE ACE-PRAMOXINE 1-1 % EX CREA
TOPICAL_CREAM | CUTANEOUS | 11 refills | Status: DC
  Filled 2021-01-06: qty 30, 30d supply, fill #0
  Filled 2021-02-04: qty 30, 30d supply, fill #1
  Filled 2021-04-04: qty 30, 30d supply, fill #2
  Filled 2021-05-25: qty 30, 30d supply, fill #3
  Filled 2021-10-21: qty 30, 30d supply, fill #4
  Filled 2021-11-25: qty 30, 30d supply, fill #5
  Filled 2022-01-03: qty 30, 30d supply, fill #6

## 2021-01-06 MED ORDER — VYVANSE 70 MG PO CAPS
ORAL_CAPSULE | ORAL | 0 refills | Status: DC
  Filled 2021-02-04: qty 30, 30d supply, fill #0

## 2021-01-06 MED ORDER — VYVANSE 70 MG PO CAPS
ORAL_CAPSULE | ORAL | 0 refills | Status: DC
  Filled 2021-05-25: qty 30, 30d supply, fill #0

## 2021-01-07 ENCOUNTER — Other Ambulatory Visit (HOSPITAL_BASED_OUTPATIENT_CLINIC_OR_DEPARTMENT_OTHER): Payer: Self-pay

## 2021-01-11 ENCOUNTER — Other Ambulatory Visit (HOSPITAL_BASED_OUTPATIENT_CLINIC_OR_DEPARTMENT_OTHER): Payer: Self-pay

## 2021-01-14 ENCOUNTER — Other Ambulatory Visit (HOSPITAL_BASED_OUTPATIENT_CLINIC_OR_DEPARTMENT_OTHER): Payer: Self-pay | Admitting: Family Medicine

## 2021-01-14 DIAGNOSIS — Z1231 Encounter for screening mammogram for malignant neoplasm of breast: Secondary | ICD-10-CM

## 2021-01-20 ENCOUNTER — Other Ambulatory Visit (HOSPITAL_BASED_OUTPATIENT_CLINIC_OR_DEPARTMENT_OTHER): Payer: Self-pay

## 2021-01-25 ENCOUNTER — Other Ambulatory Visit (HOSPITAL_BASED_OUTPATIENT_CLINIC_OR_DEPARTMENT_OTHER): Payer: Self-pay

## 2021-02-04 ENCOUNTER — Other Ambulatory Visit (HOSPITAL_BASED_OUTPATIENT_CLINIC_OR_DEPARTMENT_OTHER): Payer: Self-pay

## 2021-02-04 MED FILL — Cyanocobalamin Inj 1000 MCG/ML: INTRAMUSCULAR | 90 days supply | Qty: 3 | Fill #1 | Status: AC

## 2021-02-07 ENCOUNTER — Other Ambulatory Visit (HOSPITAL_BASED_OUTPATIENT_CLINIC_OR_DEPARTMENT_OTHER): Payer: Self-pay

## 2021-02-08 ENCOUNTER — Other Ambulatory Visit: Payer: Self-pay

## 2021-02-08 ENCOUNTER — Ambulatory Visit (HOSPITAL_BASED_OUTPATIENT_CLINIC_OR_DEPARTMENT_OTHER)
Admission: RE | Admit: 2021-02-08 | Discharge: 2021-02-08 | Disposition: A | Discharge status: Home / Self Care | Payer: 59 | Source: Ambulatory Visit | Attending: Family Medicine | Admitting: Family Medicine

## 2021-02-08 ENCOUNTER — Encounter (HOSPITAL_BASED_OUTPATIENT_CLINIC_OR_DEPARTMENT_OTHER): Payer: Self-pay

## 2021-02-08 DIAGNOSIS — Z1231 Encounter for screening mammogram for malignant neoplasm of breast: Secondary | ICD-10-CM | POA: Diagnosis present

## 2021-02-10 ENCOUNTER — Other Ambulatory Visit: Payer: Self-pay | Admitting: Family Medicine

## 2021-02-10 DIAGNOSIS — R928 Other abnormal and inconclusive findings on diagnostic imaging of breast: Secondary | ICD-10-CM

## 2021-02-25 ENCOUNTER — Other Ambulatory Visit (HOSPITAL_BASED_OUTPATIENT_CLINIC_OR_DEPARTMENT_OTHER): Payer: Self-pay

## 2021-02-25 MED ORDER — CARESTART COVID-19 HOME TEST VI KIT
PACK | 0 refills | Status: DC
  Filled 2021-02-25: qty 4, 4d supply, fill #0

## 2021-03-01 ENCOUNTER — Ambulatory Visit
Admission: RE | Admit: 2021-03-01 | Discharge: 2021-03-01 | Disposition: A | Discharge status: Home / Self Care | Payer: 59 | Source: Ambulatory Visit | Attending: Family Medicine | Admitting: Family Medicine

## 2021-03-01 ENCOUNTER — Other Ambulatory Visit: Payer: Self-pay

## 2021-03-01 DIAGNOSIS — N6002 Solitary cyst of left breast: Secondary | ICD-10-CM | POA: Diagnosis not present

## 2021-03-01 DIAGNOSIS — Z803 Family history of malignant neoplasm of breast: Secondary | ICD-10-CM | POA: Diagnosis not present

## 2021-03-01 DIAGNOSIS — R928 Other abnormal and inconclusive findings on diagnostic imaging of breast: Secondary | ICD-10-CM

## 2021-03-01 DIAGNOSIS — R922 Inconclusive mammogram: Secondary | ICD-10-CM | POA: Diagnosis not present

## 2021-03-02 DIAGNOSIS — Z20822 Contact with and (suspected) exposure to covid-19: Secondary | ICD-10-CM | POA: Diagnosis not present

## 2021-03-21 ENCOUNTER — Other Ambulatory Visit (HOSPITAL_BASED_OUTPATIENT_CLINIC_OR_DEPARTMENT_OTHER): Payer: Self-pay

## 2021-03-21 MED ORDER — CARESTART COVID-19 HOME TEST VI KIT
PACK | 0 refills | Status: DC
  Filled 2021-03-21: qty 4, 4d supply, fill #0

## 2021-04-04 ENCOUNTER — Other Ambulatory Visit (HOSPITAL_BASED_OUTPATIENT_CLINIC_OR_DEPARTMENT_OTHER): Payer: Self-pay

## 2021-04-05 ENCOUNTER — Other Ambulatory Visit (HOSPITAL_BASED_OUTPATIENT_CLINIC_OR_DEPARTMENT_OTHER): Payer: Self-pay

## 2021-04-25 ENCOUNTER — Other Ambulatory Visit (HOSPITAL_BASED_OUTPATIENT_CLINIC_OR_DEPARTMENT_OTHER): Payer: Self-pay

## 2021-04-25 MED ORDER — INFLUENZA VAC SPLIT QUAD 0.5 ML IM SUSY
PREFILLED_SYRINGE | INTRAMUSCULAR | 0 refills | Status: DC
  Filled 2021-04-25: qty 0.5, 1d supply, fill #0

## 2021-05-12 ENCOUNTER — Other Ambulatory Visit (HOSPITAL_BASED_OUTPATIENT_CLINIC_OR_DEPARTMENT_OTHER): Payer: Self-pay

## 2021-05-25 ENCOUNTER — Other Ambulatory Visit (HOSPITAL_BASED_OUTPATIENT_CLINIC_OR_DEPARTMENT_OTHER): Payer: Self-pay

## 2021-05-25 MED FILL — Cyanocobalamin Inj 1000 MCG/ML: INTRAMUSCULAR | 90 days supply | Qty: 3 | Fill #2 | Status: AC

## 2021-05-26 ENCOUNTER — Other Ambulatory Visit (HOSPITAL_BASED_OUTPATIENT_CLINIC_OR_DEPARTMENT_OTHER): Payer: Self-pay

## 2021-08-05 ENCOUNTER — Other Ambulatory Visit (HOSPITAL_BASED_OUTPATIENT_CLINIC_OR_DEPARTMENT_OTHER): Payer: Self-pay

## 2021-08-10 ENCOUNTER — Other Ambulatory Visit (HOSPITAL_BASED_OUTPATIENT_CLINIC_OR_DEPARTMENT_OTHER): Payer: Self-pay

## 2021-08-10 ENCOUNTER — Encounter: Payer: Self-pay | Admitting: Physician Assistant

## 2021-08-10 ENCOUNTER — Ambulatory Visit: Payer: 59 | Admitting: Physician Assistant

## 2021-08-10 ENCOUNTER — Other Ambulatory Visit: Payer: Self-pay

## 2021-08-10 VITALS — BP 136/84 | HR 101 | Ht 68.0 in | Wt 188.1 lb

## 2021-08-10 DIAGNOSIS — E663 Overweight: Secondary | ICD-10-CM | POA: Diagnosis not present

## 2021-08-10 DIAGNOSIS — I10 Essential (primary) hypertension: Secondary | ICD-10-CM | POA: Diagnosis not present

## 2021-08-10 DIAGNOSIS — Z7689 Persons encountering health services in other specified circumstances: Secondary | ICD-10-CM

## 2021-08-10 DIAGNOSIS — Z1211 Encounter for screening for malignant neoplasm of colon: Secondary | ICD-10-CM

## 2021-08-10 MED ORDER — OZEMPIC (1 MG/DOSE) 4 MG/3ML ~~LOC~~ SOPN
1.0000 mg | PEN_INJECTOR | SUBCUTANEOUS | 1 refills | Status: DC
  Filled 2021-08-10: qty 9, 84d supply, fill #0
  Filled 2021-08-24 – 2021-10-17 (×4): qty 9, 84d supply, fill #1

## 2021-08-10 NOTE — Patient Instructions (Signed)
Take 1/2 HCTZ Continue on wegovy Get colonoscopy Monitor BP at home

## 2021-08-10 NOTE — Progress Notes (Signed)
New Patient Office Visit  Subjective:  Patient ID: Michelle Evans, female    DOB: January 18, 1976  Age: 46 y.o. MRN: [id number]  CC:  Chief Complaint  Patient presents with   Establish Care    HPI Michelle Evans presents to establish care.   She continues to work on weight loss with ozempic. She is down from 200lbs to 188lbs today. She denies any side effects or concerns. She wants to continue on this. Her goal weight is about 155.   She does not check her BP at home. Denies any CP, palpitations, headaches, or vision changes. HCTZ makes her urinate a lot so she does not take every day.   Past Medical History:  Diagnosis Date   Abnormal Pap smear    18 yrs ago  cone biopsy   Acute upper respiratory infections of other multiple sites    ADD (attention deficit disorder)    Anemia    Dysmenorrhea    Fibroid tumor    Wheezing     Past Surgical History:  Procedure Laterality Date   CERVICAL CONE BIOPSY      Family History  Problem Relation Age of Onset   Breast cancer Mother 57   Hypertension Mother    Ovarian cancer Mother 63   Diabetes Maternal Grandmother    Hypertension Maternal Grandmother    Heart disease Maternal Grandmother    Cancer Maternal Grandfather        colon and prostate   CVA Maternal Grandfather     Social History   Socioeconomic History   Marital status: Widowed    Spouse name: Linus Orn   Number of children: 2   Years of education: 12+   Highest education level: Not on file  Occupational History   Occupation: pharm Engineer, production: [employer]  Tobacco Use   Smoking status: Former    Packs/day: 0.30    Years: 16.00    Pack years: 4.80    Types: Cigarettes   Smokeless tobacco: Never   Tobacco comments:    She is trying to quit by reduce the number of cigarrettes she smokes  Vaping Use   Vaping Use: Never used  Substance and Sexual Activity   Alcohol use: Yes    Comment: 2 per week   Drug use: No   Sexual activity: Yes    Partners: Male   Other Topics Concern   Not on file  Social History Narrative   Marital Status: Married Research scientist (medical))    Children: Sons  ([place], Psychologist, prison and probation services)    Pets: None    Living Situation: Lives with  husband and children    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    Education: Some College    Tobacco Use/Exposure:  Current every day smoker.  She smokes about 5 cigarettes per day. She has smoked for 16 years.     Alcohol Use:  Occasional (Mixed Drinks)    Drug Use:  None   Diet:  Regular   Exercise:  Waking 2-3 miles per week    Hobbies: Reading, Shopping            Social Determinants of Health   Financial Resource Strain: Not on file  Food Insecurity: Not on file  Transportation Needs: Not on file  Physical Activity: Not on file  Stress: Not on file  Social Connections: Not on file  Intimate Partner Violence: Not on file    ROS Review of Systems  All other systems reviewed and are negative.  Objective:   Today's Vitals: BP 136/84 (BP Location: Right Arm)    Pulse (!) 101    Ht 5\' 8"  (1.727 m)    Wt 188 lb 1.3 oz (85.3 kg)    SpO2 99%    BMI 28.60 kg/m   Physical Exam Vitals reviewed.  Constitutional:      Appearance: Normal appearance.  HENT:     Head: Normocephalic.  Neck:     Vascular: No carotid bruit.  Cardiovascular:     Rate and Rhythm: Normal rate and regular rhythm.     Pulses: Normal pulses.  Pulmonary:     Effort: Pulmonary effort is normal.     Breath sounds: Normal breath sounds.  Lymphadenopathy:     Cervical: No cervical adenopathy.  Neurological:     General: No focal deficit present.     Mental Status: She is alert and oriented to person, place, and time.  Psychiatric:        Mood and Affect: Mood normal.   .. Depression screen [facility] 2/9 08/10/2021  Decreased Interest 0  Down, Depressed, Hopeless 0  PHQ - 2 Score 0  Altered sleeping 0  Tired, decreased energy 0  Change in appetite 0  Feeling bad or failure about yourself  0   Trouble concentrating 0  Moving slowly or fidgety/restless 0  Suicidal thoughts 0  PHQ-9 Score 0  Difficult doing work/chores Not difficult at all   .Marland Kitchen GAD 7 : Generalized Anxiety Score 08/10/2021  Nervous, Anxious, on Edge 0  Control/stop worrying 0  Worry too much - different things 0  Trouble relaxing 0  Restless 0  Easily annoyed or irritable 0  Afraid - awful might happen 0  Total GAD 7 Score 0  Anxiety Difficulty Not difficult at all     Assessment & Plan:  Marland KitchenMarland KitchenDelorice was seen today for establish care.  Diagnoses and all orders for this visit:  Encounter to establish care  Colon cancer screening -     Ambulatory referral to Gastroenterology  Overweight -     Semaglutide, 1 MG/DOSE, (OZEMPIC, 1 MG/DOSE,) 4 MG/3ML SOPN; Inject 1 mg into the skin once a week.  Primary hypertension  Lab in epic.  Refilled ozempic. Continue diet and exercise.  Make sure taking at least 1/2 HCTZ and check BP at home. 2nd check BP much improved. She did not have her BP medication today.  Needs colonoscopy Follow up in 6 months.     Follow-up: Return in about 6 months (around 02/07/2022).   Iran Planas, PA-C

## 2021-08-15 ENCOUNTER — Encounter: Payer: Self-pay | Admitting: Obstetrics and Gynecology

## 2021-08-15 ENCOUNTER — Other Ambulatory Visit (HOSPITAL_COMMUNITY)
Admission: RE | Admit: 2021-08-15 | Discharge: 2021-08-15 | Disposition: A | Discharge status: Home / Self Care | Payer: 59 | Source: Ambulatory Visit | Attending: Obstetrics and Gynecology | Admitting: Obstetrics and Gynecology

## 2021-08-15 ENCOUNTER — Other Ambulatory Visit: Payer: Self-pay

## 2021-08-15 ENCOUNTER — Ambulatory Visit (INDEPENDENT_AMBULATORY_CARE_PROVIDER_SITE_OTHER): Payer: 59 | Admitting: Obstetrics and Gynecology

## 2021-08-15 VITALS — BP 122/80 | HR 82 | Ht 68.0 in | Wt 190.0 lb

## 2021-08-15 DIAGNOSIS — Z01419 Encounter for gynecological examination (general) (routine) without abnormal findings: Secondary | ICD-10-CM | POA: Insufficient documentation

## 2021-08-15 DIAGNOSIS — N920 Excessive and frequent menstruation with regular cycle: Secondary | ICD-10-CM | POA: Diagnosis not present

## 2021-08-15 NOTE — Progress Notes (Signed)
GYNECOLOGY ANNUAL PREVENTATIVE CARE ENCOUNTER NOTE  History:     Michelle Evans is a 46 y.o. [id number] female here for a routine annual gynecologic exam.    Current complaints: very HVB with menses. She has to wear a overnight adult depends for the bleeding. Her last pelvic US was in 2013. During the day she has to change her ultra tampon q 1 hour. She passes large clots. She only bleeds at her period. She does take iron every other day leading up to her period.   Mom went through menopause at 33. One sister had ablation, one had [place]. The one who had [place] had to have hyst.   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    Gynecologic History Patient's last menstrual period was 07/30/2021. Contraception: coitus interruptus and NFP Last Pap: 2015. Result was normal with normal HPV Last Mammogram: 2022.  Result was abnormal but f/u testing was normal. Due in July 2023.  Last Colonoscopy: Not yet had  Obstetric History OB History  Gravida Para Term Preterm AB Living  2 2 2     2   SAB IAB Ectopic Multiple Live Births               # Outcome Date GA Lbr Len/2nd Weight Sex Delivery Anes PTL Lv  2 Term      Vag-Spont     1 Term      Vag-Spont       Past Medical History:  Diagnosis Date   Abnormal Pap smear    18 yrs ago  cone biopsy   Acute upper respiratory infections of other multiple sites    ADD (attention deficit disorder)    Anemia    Dysmenorrhea    Fibroid tumor    Wheezing     Past Surgical History:  Procedure Laterality Date   CERVICAL CONE BIOPSY      Current Outpatient Medications on File Prior to Visit  Medication Sig Dispense Refill   albuterol (VENTOLIN HFA) 108 (90 Base) MCG/ACT inhaler INHALE 2 PUFFS INTO THE LUNGS EVERY 6 HOURS AS NEEDED FOR WHEEZING 18 g 11   cyanocobalamin (,VITAMIN B-12,) 1000 MCG/ML injection INJECT 1 ML UNDER THE SKIN EVERY 30 DAYS 3 mL 3   fluticasone (FLONASE) 50 MCG/ACT nasal spray Spray 2 sprays in each nostril daily. 48 g 3    hydrochlorothiazide (HYDRODIURIL) 25 MG tablet TAKE 1 TABLET BY MOUTH ONCE DAILY 90 tablet 3   LINZESS 145 MCG CAPS capsule TAKE 1 CAPSULE BY MOUTH ONCE DAILY 90 capsule 3   lisdexamfetamine (VYVANSE) 70 MG capsule Take 1 capsule (70 mg total) by mouth every morning. 30 capsule 0   mometasone (ELOCON) 0.1 % ointment APPLY ON TO THE SKIN 2 TIMES DAILY 45 g 11   montelukast (SINGULAIR) 10 MG tablet TAKE 1 TABLET BY MOUTH EVERY NIGHT 90 tablet 3   pramoxine-hydrocortisone (PROCTOCREAM-HC) 1-1 % rectal cream Apply cream rectally 2 times daily 30 g 11   Probiotic, Lactobacillus, CAPS Take by mouth.     Semaglutide, 1 MG/DOSE, (OZEMPIC, 1 MG/DOSE,) 4 MG/3ML SOPN Inject 1 mg into the skin once a week. 9 mL 1   No current facility-administered medications on file prior to visit.    Allergies  Allergen Reactions   Theophylline Hives, Itching and Swelling    Social History:  reports that she has quit smoking. Her smoking use included cigarettes. She has a 4.80 pack-year smoking history. She has never used smokeless tobacco.  She reports current alcohol use. She reports that she does not use drugs.  Family History  Problem Relation Age of Onset   Breast cancer Mother 7   Hypertension Mother    Ovarian cancer Mother 65   Diabetes Maternal Grandmother    Hypertension Maternal Grandmother    Heart disease Maternal Grandmother    Cancer Maternal Grandfather        colon and prostate   CVA Maternal Grandfather     The following portions of the patient's history were reviewed and updated as appropriate: allergies, current medications, past family history, past medical history, past social history, past surgical history and problem list.  Review of Systems Pertinent items noted in HPI and remainder of comprehensive ROS otherwise negative.  Physical Exam:  BP 122/80    Pulse 82    Ht 5\' 8"  (1.727 m)    Wt 190 lb (86.2 kg)    LMP 07/30/2021 Comment: lasted six days.   BMI 28.89 kg/m   CONSTITUTIONAL: Well-developed, well-nourished female in no acute distress.  HENT:  Normocephalic, atraumatic, External right and left ear normal.  EYES: Conjunctivae and EOM are normal. Pupils are equal, round, and reactive to light. No scleral icterus.  NECK: Normal range of motion, supple, no masses.  Normal thyroid.  SKIN: Skin is warm and dry. No rash noted. Not diaphoretic. No erythema. No pallor. MUSCULOSKELETAL: Normal range of motion. No tenderness.  No cyanosis, clubbing, or edema. NEUROLOGIC: Alert and oriented to person, place, and time. Normal reflexes, muscle tone coordination.  PSYCHIATRIC: Normal mood and affect. Normal behavior. Normal judgment and thought content.  CARDIOVASCULAR: Normal heart rate noted, regular rhythm RESPIRATORY: Clear to auscultation bilaterally. Effort and breath sounds normal, no problems with respiration noted.  BREASTS: Symmetric in size. No masses, tenderness, skin changes, nipple drainage, or lymphadenopathy bilaterally. Performed in the presence of a chaperone. ABDOMEN: Soft, no distention noted.  No tenderness, rebound or guarding.  PELVIC: External genitalia normal, Vagina normal without discharge, Urethra without abnormality or discharge, no bladder tenderness, cervix normal in appearance, no CMT, uterus felt enlarged about 12 wk size and some right/anterior nodular like a fibroid on that size vs fundus, no adnexal masses or tenderness. Performed in the presence of a chaperone.  Assessment and Plan:    1. Encounter for annual routine gynecological examination - Cervical cancer screening: Discussed guidelines. Pap with HPV done - Gardasil:  Did not have - STD Testing: not indicated - Birth Control: Declines - Breast Health: Encouraged self breast awareness/SBE. Teaching provided. Discussed limits of clinical breast exam for detecting breast cancer. Rx given for MXR - F/U 12 months and prn   Menorrhagia with regular cycle - We discussed all  options: expectant management, medical therapy and surgical management.  - Discussed hormonal contraceptive options as well as Aygestin and lysteda.  - We reviewed would have her do [place] first since she has the h/o fibroid to see interval growth. Discussed options surrounding surgery if fibroid is not overly large vs if it has grown. Discussed: [place], endometrial ablation, [place], hysteroscopic resection of fibroid, hysterectomy. Discussed risks of each form of surgery and potential for lack of success. Discussed long term consequences of each. Discussed recovery for each.  - For now she will do work up and then return once [place] is complete. In the interim, she will consider her options.  -     US Pelvis Complete; Future -     TSH -     CBC  Routine preventative health maintenance measures emphasized. Please refer to After Visit Summary for other counseling recommendations.   Radene Gunning, MD, [place] for [facility], [place]

## 2021-08-15 NOTE — Progress Notes (Signed)
Patient complaining of painful periods that are heavy. Michelle Alu RN

## 2021-08-16 LAB — TSH: TSH: 2.52 u[IU]/mL (ref 0.450–4.500)

## 2021-08-16 LAB — CBC
Hematocrit: 41 % (ref 34.0–46.6)
Hemoglobin: 13.4 g/dL (ref 11.1–15.9)
MCH: 28.8 pg (ref 26.6–33.0)
MCHC: 32.7 g/dL (ref 31.5–35.7)
MCV: 88 fL (ref 79–97)
Platelets: 238 10*3/uL (ref 150–450)
RBC: 4.66 x10E6/uL (ref 3.77–5.28)
RDW: 14.7 % (ref 11.7–15.4)
WBC: 10 10*3/uL (ref 3.4–10.8)

## 2021-08-17 LAB — CYTOLOGY - PAP
Comment: NEGATIVE
Diagnosis: NEGATIVE
High risk HPV: NEGATIVE

## 2021-08-18 ENCOUNTER — Other Ambulatory Visit: Payer: Self-pay

## 2021-08-18 ENCOUNTER — Other Ambulatory Visit: Payer: Self-pay | Admitting: Obstetrics and Gynecology

## 2021-08-18 ENCOUNTER — Ambulatory Visit (HOSPITAL_BASED_OUTPATIENT_CLINIC_OR_DEPARTMENT_OTHER)
Admission: RE | Admit: 2021-08-18 | Discharge: 2021-08-18 | Disposition: A | Discharge status: Home / Self Care | Payer: 59 | Source: Ambulatory Visit | Attending: Obstetrics and Gynecology | Admitting: Obstetrics and Gynecology

## 2021-08-18 DIAGNOSIS — N939 Abnormal uterine and vaginal bleeding, unspecified: Secondary | ICD-10-CM | POA: Diagnosis not present

## 2021-08-18 DIAGNOSIS — R9389 Abnormal findings on diagnostic imaging of other specified body structures: Secondary | ICD-10-CM | POA: Diagnosis not present

## 2021-08-18 DIAGNOSIS — N920 Excessive and frequent menstruation with regular cycle: Secondary | ICD-10-CM | POA: Diagnosis not present

## 2021-08-18 DIAGNOSIS — D252 Subserosal leiomyoma of uterus: Secondary | ICD-10-CM | POA: Diagnosis not present

## 2021-08-22 ENCOUNTER — Telehealth: Payer: Self-pay | Admitting: Obstetrics and Gynecology

## 2021-08-22 NOTE — Telephone Encounter (Signed)
Discussed Ultrasound findings and results from report and also from my review. I do think the 2.4 cm fibroid is likely contributing to her bleeding.   We discussed the options again and based on her experience within her family and her own knowledge, she would prefer a hysterectomy.   We discussed doing EMB at her preop appt on 2/27 and then scheduling sometime following that. We discussed we will go more in depth regarding risks etc with the hysterectomy at the preop appt.   Pt agreeable to plan. Will send note to [facility] for scheduling, ideally with Dr. Rip Harbour.   Radene Gunning, MD Attending [place], [facility] for [facility], [place]

## 2021-08-24 ENCOUNTER — Other Ambulatory Visit (HOSPITAL_BASED_OUTPATIENT_CLINIC_OR_DEPARTMENT_OTHER): Payer: Self-pay

## 2021-08-30 ENCOUNTER — Encounter: Payer: Self-pay | Admitting: Obstetrics and Gynecology

## 2021-09-01 DIAGNOSIS — H5213 Myopia, bilateral: Secondary | ICD-10-CM | POA: Diagnosis not present

## 2021-09-07 ENCOUNTER — Other Ambulatory Visit: Payer: Self-pay

## 2021-09-07 ENCOUNTER — Other Ambulatory Visit (HOSPITAL_BASED_OUTPATIENT_CLINIC_OR_DEPARTMENT_OTHER): Payer: Self-pay

## 2021-09-07 ENCOUNTER — Ambulatory Visit (AMBULATORY_SURGERY_CENTER): Payer: 59 | Admitting: *Deleted

## 2021-09-07 VITALS — Ht 68.0 in | Wt 188.0 lb

## 2021-09-07 DIAGNOSIS — Z1211 Encounter for screening for malignant neoplasm of colon: Secondary | ICD-10-CM

## 2021-09-07 MED ORDER — NA SULFATE-K SULFATE-MG SULF 17.5-3.13-1.6 GM/177ML PO SOLN
2.0000 | Freq: Once | ORAL | 0 refills | Status: AC
  Filled 2021-09-07: qty 354, 2d supply, fill #0

## 2021-09-07 MED ORDER — CARESTART COVID-19 HOME TEST VI KIT
PACK | 0 refills | Status: DC
  Filled 2021-09-07: qty 2, 4d supply, fill #0

## 2021-09-07 NOTE — Progress Notes (Signed)
No egg or soy allergy known to patient  No issues known to pt with past sedation with any surgeries or procedures Patient denies ever being told they had issues or difficulty with intubation  No FH of Malignant Hyperthermia Pt is not on diet pills Pt is not on  home 02  Pt is not on blood thinners  Pt says she takes Linzess as needed, encouraged to take Miralax daily prior to her procedure. No A fib or A flutter  Pt is fully vaccinated  for Covid    Due to the COVID-19 pandemic we are asking patients to follow certain guidelines in PV and the [place]   Pt aware of COVID protocols and [facility] guidelines   PV completed over the phone. Pt verified name, DOB, address and insurance during PV today.   Pt encouraged to call with questions or issues.  If pt has My chart, procedure instructions sent via My Chart

## 2021-09-13 ENCOUNTER — Other Ambulatory Visit (HOSPITAL_BASED_OUTPATIENT_CLINIC_OR_DEPARTMENT_OTHER): Payer: Self-pay

## 2021-09-13 MED FILL — Cyanocobalamin Inj 1000 MCG/ML: INTRAMUSCULAR | 90 days supply | Qty: 3 | Fill #3 | Status: AC

## 2021-09-16 ENCOUNTER — Other Ambulatory Visit: Payer: Self-pay

## 2021-09-16 ENCOUNTER — Encounter (HOSPITAL_BASED_OUTPATIENT_CLINIC_OR_DEPARTMENT_OTHER): Payer: Self-pay | Admitting: Obstetrics and Gynecology

## 2021-09-16 DIAGNOSIS — Z01818 Encounter for other preprocedural examination: Secondary | ICD-10-CM | POA: Diagnosis not present

## 2021-09-16 DIAGNOSIS — N920 Excessive and frequent menstruation with regular cycle: Secondary | ICD-10-CM | POA: Diagnosis not present

## 2021-09-16 NOTE — Progress Notes (Addendum)
Spoke w/ via phone for pre-op interview---[facility] Lab needs dos----  urine pregnancy POCT per anesthesia, surgeon's orders pending as of 09/16/21             Lab results------09/23/21 lab appt for CBC, BMP, type & screen, EKG COVID test -----patient states asymptomatic no test needed Arrive at -------0800 on Tuesday, 09/27/21 NPO after MN NO Solid Food.  Clear liquids from MN until---0700 Med rec completed Medications to take morning of surgery -----Albuterol inhaler, Flonase prn, Singulair prn Diabetic medication -----n/a Patient instructed no nail polish to be worn day of surgery Patient instructed to bring photo id and insurance card day of surgery Patient aware to have Driver (ride ) / caregiver    for 24 hours after surgery - sister Patient Special Instructions -----Bring inhaler. Extended recovery instructions given. Pre-Op special Istructions -----Requested orders from Dr. Damita Dunnings via Epic IB on 09/16/21. Patient verbalized understanding of instructions that were given at this phone interview. Patient denies shortness of breath, chest pain, fever, cough at this phone interview.

## 2021-09-16 NOTE — Progress Notes (Signed)
PLEASE WEAR A MASK OUT IN PUBLIC AND SOCIAL DISTANCE AND [place] YOUR HANDS FREQUENTLY. PLEASE ASK ALL YOUR CLOSE HOUSEHOLD CONTACT TO WEAR MASK OUT IN PUBLIC AND SOCIAL DISTANCE AND [place] HANDS FREQUENTLY ALSO.      Your procedure is scheduled on Tuesday, 09/27/21.  Report to [place] AT 8:00 AM.   Call this number if you have problems the morning of surgery  :[phone].   OUR ADDRESS IS [address].  WE ARE LOCATED IN [facility].  PLEASE BRING YOUR INSURANCE CARD AND PHOTO ID DAY OF SURGERY.  ONLY ONE PERSON ALLOWED IN FACILITY WAITING AREA.                                     REMEMBER:  DO NOT EAT FOOD, CANDY GUM OR MINTS  AFTER MIDNIGHT THE NIGHT BEFORE YOUR SURGERY . YOU MAY HAVE CLEAR LIQUIDS FROM MIDNIGHT THE NIGHT BEFORE YOUR SURGERY UNTIL  7:00 AM. NO CLEAR LIQUIDS AFTER   7:00 AM DAY OF SURGERY.   YOU MAY  BRUSH YOUR TEETH MORNING OF SURGERY AND RINSE YOUR MOUTH OUT, NO CHEWING GUM CANDY OR MINTS.    CLEAR LIQUID DIET   Foods Allowed                                                                     Foods Excluded  Coffee and tea, regular and decaf                             liquids that you cannot  Plain Jell-O any favor except red or purple                                           see through such as: Fruit ices (not with fruit pulp)                                     milk, soups, orange juice  Iced Popsicles                                    All solid food Carbonated beverages, regular and diet                                    Cranberry, grape and apple juices Sports drinks like Gatorade  Sample Menu Breakfast                                Lunch  Supper Cranberry juice                                           Jell-O                                     Grape juice                           Apple juice Coffee or tea                        Jell-O                                       Popsicle                                                Coffee or tea                        Coffee or tea  _____________________________________________________________________     TAKE ONLY THESE MEDICATIONS MORNING OF SURGERY WITH A SIP OF WATER:  Albuterol inhaler, Flonase if needed, Singulair if needed   ONE VISITOR IS ALLOWED IN WAITING ROOM ONLY DAY OF SURGERY.  YOU MAY HAVE ANOTHER PERSON SWITCH OUT WITH THE  1  VISITOR IN THE WAITING ROOM DAY OF SURGERY AND A MASK MUST BE WORN IN THE WAITING ROOM.    2 VISITORS  MAY VISIT IN THE EXTENDED RECOVERY ROOM UNTIL 800 PM ONLY 1 VISITOR AGE 25 AND OVER MAY SPEND THE NIGHT AND MUST BE IN EXTENDED RECOVERY ROOM NO LATER THAN 800 PM .    UP TO 2 CHILDREN AGE 67 TO 15 MAY ALSO VISIT IN EXTENDED RECOV ERY ROOM ONLY UNTIL 800 PM AND MUST LEAVE BY 800 PM.   ALL PERSONS VISITING IN EXTENDED RECOVERY ROOM MUST WEAR A MASK.                                    DO NOT WEAR JEWERLY, MAKE UP. DO NOT WEAR LOTIONS, POWDERS, PERFUMES OR NAIL POLISH ON YOUR FINGERNAILS. TOENAIL POLISH IS OK TO WEAR. DO NOT SHAVE FOR 48 HOURS PRIOR TO DAY OF SURGERY. MEN MAY SHAVE FACE AND NECK. CONTACTS, GLASSES, OR DENTURES MAY NOT BE WORN TO SURGERY.                                    [facility] IS NOT RESPONSIBLE  FOR ANY BELONGINGS.                                                                    [facility]  [place] - Preparing for Surgery Before surgery, you can play an important role.  Because skin is not sterile, your skin needs to be as free of germs as possible.  You can reduce the number of germs on your skin by washing with CHG (chlorahexidine gluconate) soap before surgery.  CHG is an antiseptic cleaner which kills germs and bonds with the skin to continue killing germs even after washing. Please DO NOT use if you have an allergy to CHG or antibacterial soaps.  If your skin becomes reddened/irritated stop using the CHG and inform your nurse when you  arrive at Short Stay. Do not shave (including legs and underarms) for at least 48 hours prior to the first CHG shower.  You may shave your face/neck. Please follow these instructions carefully:  1.  Shower with CHG Soap the night before surgery and the  morning of Surgery.  2.  If you choose to wash your hair, wash your hair first as usual with your  normal  shampoo.  3.  After you shampoo, rinse your hair and body thoroughly to remove the  shampoo.                            4.  Use CHG as you would any other liquid soap.  You can apply chg directly  to the skin and wash                      Gently with a scrungie or clean washcloth.  5.  Apply the CHG Soap to your body ONLY FROM THE NECK DOWN.   Do not use on face/ open                           Wound or open sores. Avoid contact with eyes, ears mouth and genitals (private parts).                       Wash face,  Genitals (private parts) with your normal soap.             6.  Wash thoroughly, paying special attention to the area where your surgery  will be performed.  7.  Thoroughly rinse your body with warm water from the neck down.  8.  DO NOT shower/wash with your normal soap after using and rinsing off  the CHG Soap.                9.  Pat yourself dry with a clean towel.            10.  Wear clean pajamas.            11.  Place clean sheets on your bed the night of your first shower and do not  sleep with pets. Day of Surgery : Do not apply any lotions/deodorants the morning of surgery.  Please wear clean clothes to the hospital/surgery center.  IF YOU HAVE ANY SKIN IRRITATION OR PROBLEMS WITH THE SURGICAL SOAP, PLEASE GET A BAR OF GOLD DIAL SOAP AND SHOWER THE NIGHT BEFORE YOUR SURGERY AND THE MORNING OF YOUR SURGERY. PLEASE LET THE NURSE KNOW MORNING OF YOUR SURGERY IF YOU HAD ANY PROBLEMS WITH THE SURGICAL SOAP.   ________________________________________________________________________  QUESTIONS CALL Gunhild Bautch PRE OP NURSE PHONE [phone].

## 2021-09-19 ENCOUNTER — Other Ambulatory Visit (HOSPITAL_COMMUNITY)
Admission: RE | Admit: 2021-09-19 | Discharge: 2021-09-19 | Disposition: A | Discharge status: Home / Self Care | Payer: 59 | Source: Ambulatory Visit | Attending: Obstetrics and Gynecology | Admitting: Obstetrics and Gynecology

## 2021-09-19 ENCOUNTER — Other Ambulatory Visit: Payer: Self-pay

## 2021-09-19 ENCOUNTER — Encounter: Payer: Self-pay | Admitting: Obstetrics and Gynecology

## 2021-09-19 ENCOUNTER — Ambulatory Visit: Payer: 59 | Admitting: Obstetrics and Gynecology

## 2021-09-19 ENCOUNTER — Encounter: Payer: Self-pay | Admitting: Gastroenterology

## 2021-09-19 VITALS — BP 124/82 | HR 86 | Wt 188.0 lb

## 2021-09-19 DIAGNOSIS — Z01818 Encounter for other preprocedural examination: Secondary | ICD-10-CM | POA: Diagnosis not present

## 2021-09-19 DIAGNOSIS — N920 Excessive and frequent menstruation with regular cycle: Secondary | ICD-10-CM | POA: Insufficient documentation

## 2021-09-19 DIAGNOSIS — N858 Other specified noninflammatory disorders of uterus: Secondary | ICD-10-CM | POA: Diagnosis not present

## 2021-09-19 NOTE — Progress Notes (Signed)
GYNECOLOGY OFFICE VISIT NOTE  History:   Michelle Evans is a 46 y.o. [phone] here today for preoperative visit and EMB for HVB.   She has very HVB with menses. She has to wear a overnight adult depends for the bleeding. Her last pelvic US was in 2013. During the day she has to change her ultra tampon q 1 hour. She passes large clots. She only bleeds at her period. She does take iron every other day leading up to her period.   She had an [place] on 1/26 which showed a couple fibroids and overall uterus size of 11.2x8x7 cm.    Mom went through menopause at 47. One sister had ablation, one had [place]. The one who had [place] had to have hyst. .        Past Medical History:  Diagnosis Date   Abnormal Pap smear    18 yrs ago  cone biopsy   Acute upper respiratory infections of other multiple sites    ADD (attention deficit disorder)    Anemia    1990's   Dysmenorrhea    Fibroid tumor    Hypertension    Mild reactive airways disease    Follows with Dr. Iran Planas PCP @ [place]   Wears contact lenses    Wears glasses    Wheezing     Past Surgical History:  Procedure Laterality Date   CERVICAL CONE BIOPSY  1994    The following portions of the patient's history were reviewed and updated as appropriate: allergies, current medications, past family history, past medical history, past social history, past surgical history and problem list.   Health Maintenance:   Normal pap and negative HRHPV on 08/15/21 Diagnosis  Date Value Ref Range Status  08/15/2021   Final   - Negative for intraepithelial lesion or malignancy (NILM)    Review of Systems:  Pertinent items noted in HPI and remainder of comprehensive ROS otherwise negative.  Physical Exam:  BP 124/82    Pulse 86    Wt 188 lb (85.3 kg)    LMP 09/18/2021 (Approximate)    BMI 28.59 kg/m  CONSTITUTIONAL: Well-developed, well-nourished female in no acute distress.  HEENT:  Normocephalic, atraumatic. External right  and left ear normal. No scleral icterus.  NECK: Normal range of motion, supple, no masses noted on observation SKIN: No rash noted. Not diaphoretic. No erythema. No pallor. MUSCULOSKELETAL: Normal range of motion. No edema noted. NEUROLOGIC: Alert and oriented to person, place, and time. Normal muscle tone coordination. No cranial nerve deficit noted. PSYCHIATRIC: Normal mood and affect. Normal behavior. Normal judgment and thought content.  CARDIOVASCULAR: Normal heart rate noted RESPIRATORY: Effort and breath sounds normal, no problems with respiration noted ABDOMEN: No masses noted. No other overt distention noted.    PELVIC: Normal appearing external genitalia; normal urethral meatus; normal appearing vaginal mucosa and cervix.  No abnormal discharge noted.  Performed in the presence of a chaperone  Labs and Imaging No results found for this or any previous visit (from the past 168 hour(s)). No results found.  08/18/21: TVUS - I reviewed the [place] images myself. I agree with the findings of the uterine size and lack of abnormal adnexal findings.   Her fibroids are as described - 4 clear subserosal and intramural fibroids, the largest being 3.6 cm. I do not think they will interfere with my ability to do her hysterectomy vaginally.      GYNECOLOGY OFFICE PROCEDURE NOTE   Michelle  DESHIA Evans is a 46 y.o. [id number] here for endometrial biopsy for AUB. Recent ultrasound also showed fibroids, EL 19 mm.     ENDOMETRIAL BIOPSY     The indications for endometrial biopsy were reviewed.   Risks of the biopsy including cramping, bleeding, infection, uterine perforation, inadequate specimen and need for additional procedures were discussed. Offered alternative of hysteroscopy, dilation and curettage in OR. The patient states she understands the R/B/I/A and agrees to undergo procedure today. Urine pregnancy test was Negative. Consent was signed. Time out was performed.    Patient was positioned in dorsal  lithotomy position. A vaginal speculum was placed.  The cervix was visualized and was prepped with Betadine.  A single-toothed tenaculum was placed on the anterior lip of the cervix to stabilize it. The 3 mm pipelle was easily introduced into the endometrial cavity without difficulty to a depth of 10 cm, and a Moderate amount of tissue was obtained after two passes and sent to pathology. The instruments were removed from the patient's vagina. Minimal bleeding from the cervix was noted. The patient tolerated the procedure well.   Patient was given post procedure instructions.    Assessment and Plan:   1. Menorrhagia with regular cycle - Diagnosis: menorrhagia with fibroids (likely not the cause as they are intramural/subserosal) - EMB done today for EL 82mm and HVB - Planned surgery: TVH, bilateral salpingectomy - Risks of surgery include but are not limited to: bleeding, infection, injury to surrounding organs/tissues (i.e. bowel/bladder/ureters), need for additional procedures, wound complications, hospital re-admission, and conversion to open or laparoscopic surgery, inability to remove the tubes - We discussed postop restrictions, precautions and expectations - Preop testing needed:  EMB - done today - All questions answered - Surgery is scheduled for 3/7. Will schedule postop visit    Routine preventative health maintenance measures emphasized. Please refer to After Visit Summary for other counseling recommendations.   Return in about 4 weeks (around 10/17/2021) for post-op check up- her surgery is on 3/7.  Radene Gunning, MD, [place] for [facility], [place]

## 2021-09-21 ENCOUNTER — Other Ambulatory Visit: Payer: Self-pay

## 2021-09-21 ENCOUNTER — Encounter: Payer: Self-pay | Admitting: Gastroenterology

## 2021-09-21 ENCOUNTER — Ambulatory Visit (AMBULATORY_SURGERY_CENTER): Payer: 59 | Admitting: Gastroenterology

## 2021-09-21 VITALS — BP 126/82 | HR 83 | Temp 98.4°F | Resp 13 | Ht 68.0 in | Wt 188.0 lb

## 2021-09-21 DIAGNOSIS — K642 Third degree hemorrhoids: Secondary | ICD-10-CM | POA: Diagnosis not present

## 2021-09-21 DIAGNOSIS — D125 Benign neoplasm of sigmoid colon: Secondary | ICD-10-CM

## 2021-09-21 DIAGNOSIS — K602 Anal fissure, unspecified: Secondary | ICD-10-CM

## 2021-09-21 DIAGNOSIS — Z1211 Encounter for screening for malignant neoplasm of colon: Secondary | ICD-10-CM | POA: Diagnosis not present

## 2021-09-21 LAB — SURGICAL PATHOLOGY

## 2021-09-21 MED ORDER — SODIUM CHLORIDE 0.9 % IV SOLN: 500.0000 mL | Freq: Once | INTRAVENOUS | Status: DC

## 2021-09-21 MED ORDER — AMBULATORY NON FORMULARY MEDICATION: 1 refills | Status: AC

## 2021-09-21 NOTE — Patient Instructions (Signed)
Thank you for allowing [place] to care for you today. ?Await pathology results, approximately 2 weeks.  Will make recommendation at that time for future colonoscopy. ?Resume previous diet and medications today. ?Return to normal activities tomorrow. ?Prescription given for Nitrogel 0.125%.  Apply pea size to affected area twice per day.  Avoid strenuous activity or exercise for 30 minutes after applications. ?If hemorrhoid symptoms persist, notify our office so we may place a referral to the colo-rectal surgery clinic. ? ? ? ? ? ?YOU HAD AN ENDOSCOPIC PROCEDURE TODAY AT [facility]:   Refer to the procedure report that was given to you for any specific questions about what was found during the examination.  If the procedure report does not answer your questions, please call your gastroenterologist to clarify.  If you requested that your care partner not be given the details of your procedure findings, then the procedure report has been included in a sealed envelope for you to review at your convenience later. ? ?YOU SHOULD EXPECT: Some feelings of bloating in the abdomen. Passage of more gas than usual.  Walking can help get rid of the air that was put into your GI tract during the procedure and reduce the bloating. If you had a lower endoscopy (such as a colonoscopy or flexible sigmoidoscopy) you may notice spotting of blood in your stool or on the toilet paper. If you underwent a bowel prep for your procedure, you may not have a normal bowel movement for a few days. ? ?Please Note:  You might notice some irritation and congestion in your nose or some drainage.  This is from the oxygen used during your procedure.  There is no need for concern and it should clear up in a day or so. ? ?SYMPTOMS TO REPORT IMMEDIATELY: ? ?Following lower endoscopy (colonoscopy or flexible sigmoidoscopy): ? Excessive amounts of blood in the stool ? Significant tenderness or worsening of abdominal pains ? Swelling of the  abdomen that is new, acute ? Fever of 100?F or higher ? ? ? ?For urgent or emergent issues, a gastroenterologist can be reached at any hour by calling [phone]. ?Do not use MyChart messaging for urgent concerns.  ? ? ?DIET:  We do recommend a small meal at first, but then you may proceed to your regular diet.  Drink plenty of fluids but you should avoid alcoholic beverages for 24 hours. ? ?ACTIVITY:  You should plan to take it easy for the rest of today and you should NOT DRIVE or use heavy machinery until tomorrow (because of the sedation medicines used during the test).   ? ?FOLLOW UP: ?Our staff will call the number listed on your records 48-72 hours following your procedure to check on you and address any questions or concerns that you may have regarding the information given to you following your procedure. If we do not reach you, we will leave a message.  We will attempt to reach you two times.  During this call, we will ask if you have developed any symptoms of COVID 19. If you develop any symptoms (ie: fever, flu-like symptoms, shortness of breath, cough etc.) before then, please call [phone].  If you test positive for Covid 19 in the 2 weeks post procedure, please call and report this information to [place].   ? ?If any biopsies were taken you will be contacted by phone or by letter within the next 1-3 weeks.  Please call us at [phone] if you have  not heard about the biopsies in 3 weeks.  ? ? ?SIGNATURES/CONFIDENTIALITY: ?You and/or your care partner have signed paperwork which will be entered into your electronic medical record.  These signatures attest to the fact that that the information above on your After Visit Summary has been reviewed and is understood.  Full responsibility of the confidentiality of this discharge information lies with you and/or your care-partner.  ?

## 2021-09-21 NOTE — Progress Notes (Signed)
Called to room to assist during endoscopic procedure.  Patient ID and intended procedure confirmed with present staff. Received instructions for my participation in the procedure from the performing physician.  

## 2021-09-21 NOTE — Progress Notes (Signed)
? ?GASTROENTEROLOGY PROCEDURE H&P NOTE  ? ?Primary Care Physician: ?Donella Stade, PA-C ? ? ? ?Reason for Procedure:  Colon Cancer screening ? ?Plan:    Colonoscopy ? ?Patient is appropriate for endoscopic procedure(s) in the ambulatory ([facility]) setting. ? ?The nature of the procedure, as well as the risks, benefits, and alternatives were carefully and thoroughly reviewed with the patient. Ample time for discussion and questions allowed. The patient understood, was satisfied, and agreed to proceed.  ? ? ? ?HPI: ?Michelle Evans is a 46 y.o. female who presents for colonoscopy for routine Colon Cancer screening.  No active GI symptoms.   ? ?Past Medical History:  ?Diagnosis Date  ? Abnormal Pap smear   ? 18 yrs ago  cone biopsy  ? Acute upper respiratory infections of other multiple sites   ? ADD (attention deficit disorder)   ? Anemia   ? 1990's  ? Dysmenorrhea   ? Fibroid tumor   ? Hypertension   ? Mild reactive airways disease   ? Follows with Dr. Iran Planas PCP @ [facility]  ? Wears contact lenses   ? Wears glasses   ? Wheezing   ? ? ?Past Surgical History:  ?Procedure Laterality Date  ? CERVICAL CONE BIOPSY  1994  ? ? ?Prior to Admission medications   ?Medication Sig Start Date End Date Taking? Authorizing Provider  ?albuterol (VENTOLIN HFA) 108 (90 Base) MCG/ACT inhaler INHALE 2 PUFFS INTO THE LUNGS EVERY 6 HOURS AS NEEDED FOR WHEEZING 10/07/20 10/07/21 Yes Zanard, Bernadene Bell, MD  ?fluticasone (FLONASE) 50 MCG/ACT nasal spray Spray 2 sprays in each nostril daily. ?Patient taking differently: Place into both nostrils. As needed 11/17/20  Yes   ?hydrochlorothiazide (HYDRODIURIL) 25 MG tablet TAKE 1 TABLET BY MOUTH ONCE DAILY 10/07/20 10/07/21 Yes Zanard, Bernadene Bell, MD  ?lisdexamfetamine (VYVANSE) 70 MG capsule Take 1 capsule (70 mg total) by mouth every morning. 03/09/21  Yes   ?Multiple Vitamin (MULTIVITAMIN WITH MINERALS) TABS tablet Take 1 tablet by mouth daily.   Yes [provider]   ?pramoxine-hydrocortisone (PROCTOCREAM-HC) 1-1 % rectal cream Apply cream rectally 2 times daily 01/06/21  Yes   ?Probiotic Product (PROBIOTIC ADVANCED PO) Take by mouth.   Yes [provider]  ?Probiotic, Lactobacillus, CAPS Take by mouth. 08/09/17  Yes [provider]  ?cyanocobalamin (,VITAMIN B-12,) 1000 MCG/ML injection INJECT 1 ML UNDER THE SKIN EVERY 30 DAYS 10/07/20 12/18/21  Jonathon Resides, MD  ?Rolan Lipa 145 MCG CAPS capsule TAKE 1 CAPSULE BY MOUTH ONCE DAILY 10/07/20 10/07/21  Zanard, Bernadene Bell, MD  ?mometasone (ELOCON) 0.1 % ointment APPLY ON TO THE SKIN 2 TIMES DAILY ?Patient taking differently: as needed. 10/07/20 10/07/21  Zanard, Bernadene Bell, MD  ?montelukast (SINGULAIR) 10 MG tablet TAKE 1 TABLET BY MOUTH EVERY NIGHT ?Patient taking differently: Take by mouth as needed. 10/07/20 10/07/21  Zanard, Bernadene Bell, MD  ?Semaglutide, 1 MG/DOSE, (OZEMPIC, 1 MG/DOSE,) 4 MG/3ML SOPN Inject 1 mg into the skin once a week. 08/10/21   Donella Stade, PA-C  ? ? ?Current Outpatient Medications  ?Medication Sig Dispense Refill  ? albuterol (VENTOLIN HFA) 108 (90 Base) MCG/ACT inhaler INHALE 2 PUFFS INTO THE LUNGS EVERY 6 HOURS AS NEEDED FOR WHEEZING 18 g 11  ? fluticasone (FLONASE) 50 MCG/ACT nasal spray Spray 2 sprays in each nostril daily. (Patient taking differently: Place into both nostrils. As needed) 48 g 3  ? hydrochlorothiazide (HYDRODIURIL) 25 MG tablet TAKE 1 TABLET BY MOUTH ONCE DAILY  90 tablet 3  ? lisdexamfetamine (VYVANSE) 70 MG capsule Take 1 capsule (70 mg total) by mouth every morning. 30 capsule 0  ? Multiple Vitamin (MULTIVITAMIN WITH MINERALS) TABS tablet Take 1 tablet by mouth daily.    ? pramoxine-hydrocortisone (PROCTOCREAM-HC) 1-1 % rectal cream Apply cream rectally 2 times daily 30 g 11  ? Probiotic Product (PROBIOTIC ADVANCED PO) Take by mouth.    ? Probiotic, Lactobacillus, CAPS Take by mouth.    ? cyanocobalamin (,VITAMIN B-12,) 1000 MCG/ML injection INJECT 1 ML UNDER THE SKIN EVERY 30  DAYS 3 mL 3  ? LINZESS 145 MCG CAPS capsule TAKE 1 CAPSULE BY MOUTH ONCE DAILY 90 capsule 3  ? mometasone (ELOCON) 0.1 % ointment APPLY ON TO THE SKIN 2 TIMES DAILY (Patient taking differently: as needed.) 45 g 11  ? montelukast (SINGULAIR) 10 MG tablet TAKE 1 TABLET BY MOUTH EVERY NIGHT (Patient taking differently: Take by mouth as needed.) 90 tablet 3  ? Semaglutide, 1 MG/DOSE, (OZEMPIC, 1 MG/DOSE,) 4 MG/3ML SOPN Inject 1 mg into the skin once a week. 9 mL 1  ? ?Current Facility-Administered Medications  ?Medication Dose Route Frequency Provider Last Rate Last Admin  ? 0.9 %  sodium chloride infusion  500 mL Intravenous Once Mylik Pro V, DO      ? ? ?Allergies as of 09/21/2021 - Review Complete 09/21/2021  ?Allergen Reaction Noted  ? Theophylline Hives, Itching, and Swelling 09/25/2014  ? ? ?Family History  ?Problem Relation Age of Onset  ? Breast cancer Mother 37  ? Hypertension Mother   ? Ovarian cancer Mother 29  ? Diabetes Maternal Grandmother   ? Hypertension Maternal Grandmother   ? Heart disease Maternal Grandmother   ? Cancer Maternal Grandfather   ?     colon and prostate  ? CVA Maternal Grandfather   ? Colon polyps Neg Hx   ? Colon cancer Neg Hx   ? Esophageal cancer Neg Hx   ? Stomach cancer Neg Hx   ? Rectal cancer Neg Hx   ? ? ?Social History  ? ?Socioeconomic History  ? Marital status: Widowed  ?  Spouse name: Linus Orn  ? Number of children: 2  ? Years of education: 12+  ? Highest education level: Not on file  ?Occupational History  ? Occupation: Customer service manager  ?  Employer: [employer]  ?Tobacco Use  ? Smoking status: Every Day  ?  Packs/day: 0.25  ?  Years: 16.00  ?  Pack years: 4.00  ?  Types: Cigarettes  ? Smokeless tobacco: Never  ? Tobacco comments:  ?  She is trying to quit by reduce the number of cigarrettes she smokes.  ?  Hooka once every 2 or 3 months.  ?Vaping Use  ? Vaping Use: Former  ? Devices: Hooka  ?Substance and Sexual Activity  ? Alcohol use: Yes  ?  Alcohol/week: 1.0 standard  drink  ?  Types: 1 Glasses of wine per week  ?  Comment: 1 per week  ? Drug use: No  ? Sexual activity: Yes  ?  Partners: Male  ?  Birth control/protection: None  ?Other Topics Concern  ? Not on file  ?Social History Narrative  ? Marital Status: Married Research scientist (medical))   ? Children: Sons  Hydrologist, Psychologist, prison and probation services)   ? Pets: None   ? Living Situation: Lives with  husband and children   ? 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   ? Education: Some College   ?  Tobacco Use/Exposure:  Current every day smoker.  She smokes about 5 cigarettes per day. She has smoked for 16 years.    ? Alcohol Use:  Occasional (Mixed Drinks)   ? Drug Use:  None  ? Diet:  Regular  ? Exercise:  Waking 2-3 miles per week   ? Hobbies: Reading, Shopping  ?   ?   ?   ? ?Social Determinants of Health  ? ?Financial Resource Strain: Not on file  ?Food Insecurity: Not on file  ?Transportation Needs: Not on file  ?Physical Activity: Not on file  ?Stress: Not on file  ?Social Connections: Not on file  ?Intimate Partner Violence: Not on file  ? ? ?Physical Exam: ?Vital signs in last 24 hours: ?@BP  127/66   Pulse 94   Temp 98.4 ?F (36.9 ?C)   Ht 5\' 8"  (1.727 m)   Wt 188 lb (85.3 kg)   LMP 09/18/2021 (Approximate)   SpO2 100%   BMI 28.59 kg/m?  ?GEN: NAD ?EYE: Sclerae anicteric ?ENT: MMM ?CV: Non-tachycardic ?Pulm: CTA b/l ?GI: Soft, NT/ND ?NEURO:  Alert & Oriented x 3 ? ? ?Gerrit Heck, DO ?[facility] ? ? ?09/21/2021 8:40 AM ? ?

## 2021-09-21 NOTE — Op Note (Signed)
[facility] ?Patient Name: Michelle Evans ?Procedure Date: 09/21/2021 8:37 AM ?MRN: [id number] ?Endoscopist: Gerrit Heck , MD ?Age: 46 ?Referring MD:  ?Date of Birth: 03/01/1976 ?Gender: Female ?Account #: [id number] ?Procedure:                Colonoscopy ?Indications:              Screening for colorectal malignant neoplasm, This  ?                          is the patient's first colonoscopy ?Medicines:                Monitored Anesthesia Care ?Procedure:                Pre-Anesthesia Assessment: ?                          - Prior to the procedure, a History and Physical  ?                          was performed, and patient medications and  ?                          allergies were reviewed. The patient's tolerance of  ?                          previous anesthesia was also reviewed. The risks  ?                          and benefits of the procedure and the sedation  ?                          options and risks were discussed with the patient.  ?                          All questions were answered, and informed consent  ?                          was obtained. Prior Anticoagulants: The patient has  ?                          taken no previous anticoagulant or antiplatelet  ?                          agents. ASA Grade Assessment: II - A patient with  ?                          mild systemic disease. After reviewing the risks  ?                          and benefits, the patient was deemed in  ?                          satisfactory condition to undergo the procedure. ?  After obtaining informed consent, the colonoscope  ?                          was passed under direct vision. Throughout the  ?                          procedure, the patient's blood pressure, pulse, and  ?                          oxygen saturations were monitored continuously. The  ?                          Olympus CF-[id number] (#[id number]) Colonoscope was  ?                          introduced through the anus and  advanced to the the  ?                          cecum, identified by appendiceal orifice and  ?                          ileocecal valve. The colonoscopy was performed  ?                          without difficulty. The patient tolerated the  ?                          procedure well. The quality of the bowel  ?                          preparation was good. The ileocecal valve,  ?                          appendiceal orifice, and rectum were photographed. ?Scope In: 8:49:26 AM ?Scope Out: 9:03:51 AM ?Scope Withdrawal Time: 0 hours 8 minutes 53 seconds  ?Total Procedure Duration: 0 hours 14 minutes 25 seconds  ?Findings:                 An anal fissure and hemorrhoids were found on  ?                          perianal exam. ?                          A 8 mm polyp was found in the sigmoid colon. The  ?                          polyp was sessile. The polyp was removed with a  ?                          cold snare. Resection and retrieval were complete.  ?                          Estimated blood loss was minimal. ?  Non-bleeding internal hemorrhoids were found during  ?                          retroflexion. The hemorrhoids were medium-sized. ?Complications:            No immediate complications. ?Estimated Blood Loss:     Estimated blood loss was minimal. ?Impression:               - Anal fissure found on perianal exam. ?                          - One 8 mm polyp in the sigmoid colon, removed with  ?                          a cold snare. Resected and retrieved. ?                          - Non-bleeding internal hemorrhoids and external  ?                          hemorrhoids. ?Recommendation:           - Patient has a contact number available for  ?                          emergencies. The signs and symptoms of potential  ?                          delayed complications were discussed with the  ?                          patient. Return to normal activities tomorrow.  ?                           Written discharge instructions were provided to the  ?                          patient. ?                          - Resume previous diet. ?                          - Continue present medications. ?                          - Await pathology results. ?                          - Repeat colonoscopy for surveillance based on  ?                          pathology results. ?                          - Start topical Nitroglycerine 0.125%. Apply a  ?  pea-sized amount to the affected area twice daily  ?                          for 6-8 weeks. Avoid strenuous activity/exercise  ?                          within 30 minutes of application. ?                          - Sitz baths. ?                          - If hemorrhoidal symptoms persist, will place  ?                          referral to the Colo-rectal Surgery clinic. ?Gerrit Heck, MD ?09/21/2021 9:08:59 AM ?

## 2021-09-21 NOTE — Progress Notes (Signed)
Sedate, gd SR, tolerated procedure well, VSS, report to RN 

## 2021-09-21 NOTE — Progress Notes (Signed)
Pt's states no medical or surgical changes since previsit or office visit. VS by CW. 

## 2021-09-22 NOTE — H&P (Signed)
Michelle Evans is a 46 y.o. [phone] here for TVH/salpingectomy due to heavy bleeding. ?  ?She has very HVB with menses. She has to wear a overnight adult depends for the bleeding. Her last pelvic US was in 2013. During the day she has to change her ultra tampon q 1 hour. She passes large clots. She only bleeds at her period. She does take iron every other day leading up to her period.  ?  ?She had an [place] on 1/26 which showed a couple fibroids and overall uterus size of 11.2x8x7 cm.  ?  ?Mom went through menopause at 21. One sister had ablation, one had [place]. The one who had [place] had to have hyst. .  ? ?Pertinent Gynecological History: ?Menses: flow is excessive  ?Bleeding: Regular ?Contraception: none ?DES exposure: denies ?Blood transfusions: none ?Sexually transmitted diseases: no past history ?Previous GYN Procedures:  None   ?Last mammogram: abnormal: but follow up testing was normal  Date: 01/2021 ?Last pap: normal Date: 08/15/21 ?OB History: G2, P2  ? ?Menstrual History: ?LMP was 2/27 in the office on the day of EMB ?  ? ?Past Medical History:  ?Diagnosis Date  ? Abnormal Pap smear   ? 18 yrs ago  cone biopsy  ? Acute upper respiratory infections of other multiple sites   ? ADD (attention deficit disorder)   ? Anemia   ? 1990's  ? Dysmenorrhea   ? Fibroid tumor   ? Hypertension   ? Mild reactive airways disease   ? Follows with Dr. Iran Planas PCP @ [facility]  ? Wears contact lenses   ? Wears glasses   ? Wheezing   ? ? ?Past Surgical History:  ?Procedure Laterality Date  ? CERVICAL CONE BIOPSY  1994  ? ? ?Family History  ?Problem Relation Age of Onset  ? Breast cancer Mother 39  ? Hypertension Mother   ? Ovarian cancer Mother 73  ? Diabetes Maternal Grandmother   ? Hypertension Maternal Grandmother   ? Heart disease Maternal Grandmother   ? Cancer Maternal Grandfather   ?     colon and prostate  ? CVA Maternal Grandfather   ? Colon polyps Neg Hx   ? Colon cancer Neg Hx   ? Esophageal cancer Neg Hx    ? Stomach cancer Neg Hx   ? Rectal cancer Neg Hx   ? ? ?Social History:  reports that she has been smoking cigarettes. She has a 4.00 pack-year smoking history. She has never used smokeless tobacco. She reports current alcohol use of about 1.0 standard drink per week. She reports that she does not use drugs. ? ?Allergies:  ?Allergies  ?Allergen Reactions  ? Theophylline Hives, Itching and Swelling  ? ? ?No medications prior to admission.  ? ? ?Review of Systems  ?Constitutional: Negative.   ?HENT: Negative.    ?Eyes: Negative.   ?Respiratory: Negative.    ?Cardiovascular: Negative.   ?Gastrointestinal: Negative.   ?Endocrine: Negative.   ?Genitourinary:  Positive for menstrual problem and vaginal bleeding.  ?Musculoskeletal: Negative.   ?Skin: Negative.   ?Allergic/Immunologic: Negative.   ?Neurological: Negative.   ?Hematological: Negative.   ?Psychiatric/Behavioral: Negative.    ? ?Height 5\' 8"  (1.727 m), weight 83.9 kg, last menstrual period 08/28/2021. ?Physical Exam ?Constitutional:   ?   Appearance: Normal appearance. She is normal weight.  ?HENT:  ?   Head: Normocephalic and atraumatic.  ?   Nose: Nose normal.  ?   Mouth/Throat:  ?  Mouth: Mucous membranes are moist.  ?   Pharynx: Oropharynx is clear.  ?Eyes:  ?   Extraocular Movements: Extraocular movements intact.  ?   Conjunctiva/sclera: Conjunctivae normal.  ?   Pupils: Pupils are equal, round, and reactive to light.  ?Cardiovascular:  ?   Rate and Rhythm: Normal rate and regular rhythm.  ?Pulmonary:  ?   Effort: Pulmonary effort is normal.  ?Abdominal:  ?   General: Abdomen is flat. Bowel sounds are normal.  ?   Palpations: Abdomen is soft.  ?Genitourinary: ?   General: Normal vulva.  ?Musculoskeletal:     ?   General: Normal range of motion.  ?   Cervical back: Normal range of motion and neck supple.  ?Skin: ?   General: Skin is warm.  ?   Capillary Refill: Capillary refill takes less than 2 seconds.  ?Neurological:  ?   General: No focal deficit  present.  ?   Mental Status: She is alert and oriented to person, place, and time. Mental status is at baseline.  ?Psychiatric:     ?   Mood and Affect: Mood normal.     ?   Behavior: Behavior normal.     ?   Thought Content: Thought content normal.     ?   Judgment: Judgment normal.  ? ? ?No results found for this or any previous visit (from the past 24 hour(s)). ? ?No results found. ? ?Assessment/Plan: ? 1. Menorrhagia with regular cycle ?- Diagnosis: menorrhagia with fibroids (likely not the cause as they are intramural/subserosal) ?- EMB done 09/19/21 for EL 15mm and HVB -- EMB negative for dysplaisa and malignancy ?- Planned surgery: TVH, bilateral salpingectomy ?- Risks of surgery include but are not limited to: bleeding, infection, injury to surrounding organs/tissues (i.e. bowel/bladder/ureters), need for additional procedures, wound complications, hospital re-admission, and conversion to open or laparoscopic surgery, inability to remove the tubes ?- We discussed postop restrictions, precautions and expectations ?- Preop testing needed:  None additional beyond anesthesia recommendations.  ?- All questions answered ?- Surgery is scheduled for 3/7 ?  ? ?Radene Gunning ?09/22/2021, 8:23 AM ? ?

## 2021-09-23 ENCOUNTER — Encounter (HOSPITAL_COMMUNITY)
Admission: RE | Admit: 2021-09-23 | Discharge: 2021-09-23 | Disposition: A | Discharge status: Home / Self Care | Payer: 59 | Source: Ambulatory Visit | Attending: Obstetrics and Gynecology | Admitting: Obstetrics and Gynecology

## 2021-09-23 ENCOUNTER — Telehealth: Payer: Self-pay

## 2021-09-23 ENCOUNTER — Other Ambulatory Visit: Payer: Self-pay

## 2021-09-23 ENCOUNTER — Telehealth: Payer: Self-pay | Admitting: *Deleted

## 2021-09-23 DIAGNOSIS — Z01818 Encounter for other preprocedural examination: Secondary | ICD-10-CM

## 2021-09-23 DIAGNOSIS — Z01810 Encounter for preprocedural cardiovascular examination: Secondary | ICD-10-CM | POA: Diagnosis not present

## 2021-09-23 DIAGNOSIS — N920 Excessive and frequent menstruation with regular cycle: Secondary | ICD-10-CM | POA: Insufficient documentation

## 2021-09-23 LAB — CBC
HCT: 34.8 % — ABNORMAL LOW (ref 36.0–46.0)
Hemoglobin: 11.5 g/dL — ABNORMAL LOW (ref 12.0–15.0)
MCH: 29.8 pg (ref 26.0–34.0)
MCHC: 33 g/dL (ref 30.0–36.0)
MCV: 90.2 fL (ref 80.0–100.0)
Platelets: 286 10*3/uL (ref 150–400)
RBC: 3.86 MIL/uL — ABNORMAL LOW (ref 3.87–5.11)
RDW: 15.9 % — ABNORMAL HIGH (ref 11.5–15.5)
WBC: 8 10*3/uL (ref 4.0–10.5)
nRBC: 0 % (ref 0.0–0.2)

## 2021-09-23 LAB — BASIC METABOLIC PANEL
Anion gap: 8 (ref 5–15)
BUN: 11 mg/dL (ref 6–20)
CO2: 25 mmol/L (ref 22–32)
Calcium: 8.7 mg/dL — ABNORMAL LOW (ref 8.9–10.3)
Chloride: 104 mmol/L (ref 98–111)
Creatinine, Ser: 1.07 mg/dL — ABNORMAL HIGH (ref 0.44–1.00)
GFR, Estimated: >60 mL/min (ref >60)
Glucose, Bld: 76 mg/dL (ref 70–99)
Potassium: 4.2 mmol/L (ref 3.5–5.1)
Sodium: 137 mmol/L (ref 135–145)

## 2021-09-23 NOTE — Telephone Encounter (Signed)
Attempted to reach pt. With follow-up call following endoscopic procedure 09/21/2021.  LM On pt. Voice mail.  Will try to reach pt. Again later today. ?

## 2021-09-23 NOTE — Telephone Encounter (Signed)
?  Follow up Call- ? ?Call back number 09/21/2021  ?Post procedure Call Back phone  # [phone]  ?Permission to leave phone message Yes  ?Some recent data might be hidden  ?  ? ?Patient questions: ? ?Do you have a fever, pain , or abdominal swelling? No. ?Pain Score  0 * ? ?Have you tolerated food without any problems? Yes.   ? ?Have you been able to return to your normal activities? Yes.   ? ?Do you have any questions about your discharge instructions: ?Diet   No. ?Medications  No. ?Follow up visit  No. ? ?Do you have questions or concerns about your Care? No. ? ?Actions: ?* If pain score is 4 or above: ?No action needed, pain <4. ? ?Have you developed a fever since your procedure? no ? ?2.   Have you had an respiratory symptoms (SOB or cough) since your procedure? no ? ?3.   Have you tested positive for COVID 19 since your procedure no ? ?4.   Have you had any family members/close contacts diagnosed with the COVID 19 since your procedure?  no ? ? ?If yes to any of these questions please route to Joylene John, RN and Joella Prince, RN  ? ? ?

## 2021-09-27 ENCOUNTER — Ambulatory Visit (HOSPITAL_BASED_OUTPATIENT_CLINIC_OR_DEPARTMENT_OTHER)
Admission: RE | Admit: 2021-09-27 | Discharge: 2021-09-27 | Disposition: A | Discharge status: Home / Self Care | Payer: 59 | Attending: Obstetrics and Gynecology | Admitting: Obstetrics and Gynecology

## 2021-09-27 ENCOUNTER — Ambulatory Visit (HOSPITAL_BASED_OUTPATIENT_CLINIC_OR_DEPARTMENT_OTHER): Payer: 59 | Admitting: Anesthesiology

## 2021-09-27 ENCOUNTER — Other Ambulatory Visit (HOSPITAL_BASED_OUTPATIENT_CLINIC_OR_DEPARTMENT_OTHER): Payer: Self-pay

## 2021-09-27 ENCOUNTER — Encounter (HOSPITAL_BASED_OUTPATIENT_CLINIC_OR_DEPARTMENT_OTHER)
Admission: RE | Disposition: A | Discharge status: Home / Self Care | Payer: Self-pay | Source: Home / Self Care | Attending: Obstetrics and Gynecology

## 2021-09-27 ENCOUNTER — Other Ambulatory Visit: Payer: Self-pay

## 2021-09-27 ENCOUNTER — Encounter (HOSPITAL_BASED_OUTPATIENT_CLINIC_OR_DEPARTMENT_OTHER): Payer: Self-pay | Admitting: Obstetrics and Gynecology

## 2021-09-27 DIAGNOSIS — N920 Excessive and frequent menstruation with regular cycle: Secondary | ICD-10-CM | POA: Diagnosis not present

## 2021-09-27 DIAGNOSIS — I10 Essential (primary) hypertension: Secondary | ICD-10-CM | POA: Diagnosis not present

## 2021-09-27 DIAGNOSIS — F32A Depression, unspecified: Secondary | ICD-10-CM | POA: Diagnosis not present

## 2021-09-27 DIAGNOSIS — D252 Subserosal leiomyoma of uterus: Secondary | ICD-10-CM | POA: Diagnosis not present

## 2021-09-27 DIAGNOSIS — D259 Leiomyoma of uterus, unspecified: Secondary | ICD-10-CM

## 2021-09-27 DIAGNOSIS — F17210 Nicotine dependence, cigarettes, uncomplicated: Secondary | ICD-10-CM | POA: Insufficient documentation

## 2021-09-27 DIAGNOSIS — R9389 Abnormal findings on diagnostic imaging of other specified body structures: Secondary | ICD-10-CM

## 2021-09-27 DIAGNOSIS — Z01818 Encounter for other preprocedural examination: Secondary | ICD-10-CM

## 2021-09-27 DIAGNOSIS — Z90710 Acquired absence of both cervix and uterus: Secondary | ICD-10-CM | POA: Diagnosis present

## 2021-09-27 DIAGNOSIS — N8003 Adenomyosis of the uterus: Secondary | ICD-10-CM | POA: Diagnosis not present

## 2021-09-27 DIAGNOSIS — D219 Benign neoplasm of connective and other soft tissue, unspecified: Secondary | ICD-10-CM

## 2021-09-27 DIAGNOSIS — D251 Intramural leiomyoma of uterus: Secondary | ICD-10-CM | POA: Insufficient documentation

## 2021-09-27 HISTORY — DX: Presence of spectacles and contact lenses: Z97.3

## 2021-09-27 HISTORY — PX: VAGINAL HYSTERECTOMY: SHX2639

## 2021-09-27 HISTORY — DX: Unspecified asthma, uncomplicated: J45.909

## 2021-09-27 LAB — TYPE AND SCREEN
ABO/RH(D): B POS
Antibody Screen: NEGATIVE

## 2021-09-27 LAB — POCT PREGNANCY, URINE: Preg Test, Ur: NEGATIVE

## 2021-09-27 LAB — ABO/RH: ABO/RH(D): B POS

## 2021-09-27 SURGERY — HYSTERECTOMY, VAGINAL
Anesthesia: General | Site: Vagina

## 2021-09-27 MED ORDER — CEFAZOLIN SODIUM-DEXTROSE 2-4 GM/100ML-% IV SOLN
2.0000 g | Freq: Once | INTRAVENOUS | Status: AC
  Administered 2021-09-27: 2 g via INTRAVENOUS

## 2021-09-27 MED ORDER — PROPOFOL 10 MG/ML IV BOLUS
INTRAVENOUS | Status: AC
  Filled 2021-09-27: qty 20

## 2021-09-27 MED ORDER — DEXMEDETOMIDINE (PRECEDEX) IN NS 20 MCG/5ML (4 MCG/ML) IV SYRINGE
PREFILLED_SYRINGE | INTRAVENOUS | Status: DC | PRN
  Administered 2021-09-27 (×2): 10 ug via INTRAVENOUS

## 2021-09-27 MED ORDER — SUGAMMADEX SODIUM 200 MG/2ML IV SOLN
INTRAVENOUS | Status: DC | PRN
  Administered 2021-09-27: 160 mg via INTRAVENOUS

## 2021-09-27 MED ORDER — OXYCODONE HCL 5 MG PO TABS
ORAL_TABLET | ORAL | Status: AC
  Filled 2021-09-27: qty 1

## 2021-09-27 MED ORDER — OXYCODONE HCL 5 MG PO TABS
5.0000 mg | ORAL_TABLET | ORAL | Status: DC | PRN
  Administered 2021-09-27: 5 mg via ORAL

## 2021-09-27 MED ORDER — HYDROMORPHONE HCL 1 MG/ML IJ SOLN: 0.2500 mg | INTRAMUSCULAR | Status: DC | PRN

## 2021-09-27 MED ORDER — PROPOFOL 500 MG/50ML IV EMUL
INTRAVENOUS | Status: AC
  Filled 2021-09-27: qty 50

## 2021-09-27 MED ORDER — KETOROLAC TROMETHAMINE 30 MG/ML IJ SOLN
INTRAMUSCULAR | Status: AC
  Filled 2021-09-27: qty 1

## 2021-09-27 MED ORDER — DEXMEDETOMIDINE (PRECEDEX) IN NS 20 MCG/5ML (4 MCG/ML) IV SYRINGE
PREFILLED_SYRINGE | INTRAVENOUS | Status: AC
  Filled 2021-09-27: qty 5

## 2021-09-27 MED ORDER — MIDAZOLAM HCL 2 MG/2ML IJ SOLN
INTRAMUSCULAR | Status: AC
  Filled 2021-09-27: qty 2

## 2021-09-27 MED ORDER — PROPOFOL 10 MG/ML IV BOLUS
INTRAVENOUS | Status: DC | PRN
  Administered 2021-09-27: 140 mg via INTRAVENOUS
  Administered 2021-09-27: 40 mg via INTRAVENOUS

## 2021-09-27 MED ORDER — FENTANYL CITRATE (PF) 100 MCG/2ML IJ SOLN
INTRAMUSCULAR | Status: DC | PRN
  Administered 2021-09-27 (×5): 50 ug via INTRAVENOUS

## 2021-09-27 MED ORDER — KETOROLAC TROMETHAMINE 30 MG/ML IJ SOLN
INTRAMUSCULAR | Status: DC | PRN
  Administered 2021-09-27: 30 mg via INTRAVENOUS

## 2021-09-27 MED ORDER — 0.9 % SODIUM CHLORIDE (POUR BTL) OPTIME
TOPICAL | Status: DC | PRN
  Administered 2021-09-27: 500 mL

## 2021-09-27 MED ORDER — CEFAZOLIN SODIUM-DEXTROSE 2-4 GM/100ML-% IV SOLN
INTRAVENOUS | Status: AC
  Filled 2021-09-27: qty 100

## 2021-09-27 MED ORDER — DEXAMETHASONE SODIUM PHOSPHATE 10 MG/ML IJ SOLN
INTRAMUSCULAR | Status: AC
  Filled 2021-09-27: qty 1

## 2021-09-27 MED ORDER — ONDANSETRON HCL 4 MG/2ML IJ SOLN
INTRAMUSCULAR | Status: AC
  Filled 2021-09-27: qty 2

## 2021-09-27 MED ORDER — ACETAMINOPHEN 500 MG PO TABS
1000.0000 mg | ORAL_TABLET | ORAL | Status: AC
  Administered 2021-09-27: 1000 mg via ORAL

## 2021-09-27 MED ORDER — ACETAMINOPHEN 500 MG PO TABS
ORAL_TABLET | ORAL | Status: AC
  Filled 2021-09-27: qty 2

## 2021-09-27 MED ORDER — MIDAZOLAM HCL 5 MG/5ML IJ SOLN
INTRAMUSCULAR | Status: DC | PRN
  Administered 2021-09-27: 2 mg via INTRAVENOUS

## 2021-09-27 MED ORDER — LIDOCAINE HCL (PF) 2 % IJ SOLN
INTRAMUSCULAR | Status: AC
  Filled 2021-09-27: qty 5

## 2021-09-27 MED ORDER — LIDOCAINE 2% (20 MG/ML) 5 ML SYRINGE
INTRAMUSCULAR | Status: DC | PRN
  Administered 2021-09-27: 60 mg via INTRAVENOUS

## 2021-09-27 MED ORDER — KETOROLAC TROMETHAMINE 15 MG/ML IJ SOLN: 15.0000 mg | INTRAMUSCULAR | Status: DC

## 2021-09-27 MED ORDER — FENTANYL CITRATE (PF) 250 MCG/5ML IJ SOLN
INTRAMUSCULAR | Status: AC
  Filled 2021-09-27: qty 5

## 2021-09-27 MED ORDER — IBUPROFEN 200 MG PO TABS: 600.0000 mg | ORAL_TABLET | Freq: Four times a day (QID) | ORAL | Status: DC | PRN

## 2021-09-27 MED ORDER — DEXAMETHASONE SODIUM PHOSPHATE 10 MG/ML IJ SOLN
INTRAMUSCULAR | Status: DC | PRN
  Administered 2021-09-27: 10 mg via INTRAVENOUS

## 2021-09-27 MED ORDER — IBUPROFEN 800 MG PO TABS
800.0000 mg | ORAL_TABLET | Freq: Three times a day (TID) | ORAL | 3 refills | Status: DC | PRN
  Filled 2021-09-27: qty 30, 10d supply, fill #0
  Filled 2022-06-21: qty 30, 10d supply, fill #1
  Filled 2022-08-23: qty 30, 10d supply, fill #2

## 2021-09-27 MED ORDER — ROCURONIUM BROMIDE 10 MG/ML (PF) SYRINGE
PREFILLED_SYRINGE | INTRAVENOUS | Status: DC | PRN
  Administered 2021-09-27: 50 mg via INTRAVENOUS

## 2021-09-27 MED ORDER — SOD CITRATE-CITRIC ACID 500-334 MG/5ML PO SOLN: 30.0000 mL | ORAL | Status: DC

## 2021-09-27 MED ORDER — ROCURONIUM BROMIDE 10 MG/ML (PF) SYRINGE
PREFILLED_SYRINGE | INTRAVENOUS | Status: AC
  Filled 2021-09-27: qty 10

## 2021-09-27 MED ORDER — LACTATED RINGERS IV SOLN
INTRAVENOUS | Status: DC
  Administered 2021-09-27: 1000 mL via INTRAVENOUS

## 2021-09-27 MED ORDER — POVIDONE-IODINE 10 % EX SWAB
2.0000 "application " | Freq: Once | CUTANEOUS | Status: AC
  Administered 2021-09-27: 2 via TOPICAL

## 2021-09-27 MED ORDER — CEFAZOLIN SODIUM-DEXTROSE 2-4 GM/100ML-% IV SOLN
2.0000 g | INTRAVENOUS | Status: AC
  Administered 2021-09-27: 2 g via INTRAVENOUS

## 2021-09-27 MED ORDER — OXYCODONE HCL 5 MG PO TABS
5.0000 mg | ORAL_TABLET | ORAL | 0 refills | Status: DC | PRN
  Filled 2021-09-27: qty 20, 4d supply, fill #0

## 2021-09-27 SURGICAL SUPPLY — 23 items
BAG COUNTER SPONGE SURGICOUNT (BAG) ×2 IMPLANT
BAG SPNG CNTER NS LX DISP (BAG) ×1
BAG SURGICOUNT SPONGE COUNTING (BAG) ×1
DRAPE STERI URO 9X17 APER PCH (DRAPES) ×3 IMPLANT
DRSG TEGADERM 8X12 (GAUZE/BANDAGES/DRESSINGS) ×2 IMPLANT
GLOVE SURG ENC TEXT LTX SZ6 (GLOVE) ×3 IMPLANT
GLOVE SURG POLYISO LF SZ6.5 (GLOVE) ×2 IMPLANT
GLOVE SURG UNDER LTX SZ6.5 (GLOVE) ×6 IMPLANT
GLOVE SURG UNDER POLY LF SZ6.5 (GLOVE) ×2 IMPLANT
GLOVE SURG UNDER POLY LF SZ7 (GLOVE) ×8 IMPLANT
GOWN STRL REUS W/TWL LRG LVL3 (GOWN DISPOSABLE) ×10 IMPLANT
KIT TURNOVER CYSTO (KITS) ×3 IMPLANT
MANIFOLD NEPTUNE II (INSTRUMENTS) ×2 IMPLANT
NS IRRIG 500ML POUR BTL (IV SOLUTION) ×2 IMPLANT
PACK VAGINAL WOMENS (CUSTOM PROCEDURE TRAY) ×3 IMPLANT
PAD OB MATERNITY 4.3X12.25 (PERSONAL CARE ITEMS) ×6 IMPLANT
SUT VIC AB 0 CT1 18XCR BRD8 (SUTURE) IMPLANT
SUT VIC AB 0 CT1 36 (SUTURE) ×2 IMPLANT
SUT VIC AB 0 CT1 8-18 (SUTURE) ×9
SUT VICRYL 0 TIES 12 18 (SUTURE) ×2 IMPLANT
TOWEL OR 17X26 10 PK STRL BLUE (TOWEL DISPOSABLE) ×4 IMPLANT
TRAY FOLEY W/BAG SLVR 14FR (SET/KITS/TRAYS/PACK) ×3 IMPLANT
UNDERPAD 30X36 HEAVY ABSORB (UNDERPADS AND DIAPERS) ×3 IMPLANT

## 2021-09-27 NOTE — Op Note (Signed)
Michelle Evans ?PROCEDURE DATE: 09/27/2021 ? ?PREOPERATIVE DIAGNOSIS:  Menorrhagia and fibroid uterus ?POSTOPERATIVE DIAGNOSIS:  Same ?SURGEON:  Dr. Radene Gunning ?ASSISTANT:  Dr. Vonzella Nipple.  An experienced assistant was required given the standard of surgical care given the complexity of the case.  This assistant was needed for exposure, dissection, suctioning, retraction, instrument exchange, and for overall help during the procedure. ?OPERATION:  Total Vaginal hysterectomy, bilateral salpingectomy ?ANESTHESIA:  General endotracheal ? ?INDICATIONS: The patient is a 46 y.o. [id number] with history of symptomatic uterine fibroids/menorrhagia. The patient made a decision to undergo definite surgical treatment. On the preoperative visit, the risks, benefits, indications, and alternatives of the procedure were reviewed with the patient.  On the day of surgery, the risks of surgery were again discussed with the patient including but not limited to: bleeding which may require transfusion or reoperation; infection which may require antibiotics; injury to bowel, bladder, ureters or other surrounding organs; need for additional procedures; thromboembolic phenomenon, incisional problems and other postoperative/anesthesia complications. Written informed consent was obtained.   ? ?OPERATIVE FINDINGS: A 11 week size uterus with normal tubes and ovaries bilaterally. ? ?ESTIMATED BLOOD LOSS: 200 ml ?FLUIDS:  1200 ml of Lactated Ringers ?URINE OUTPUT:  600 ml of clear yellow urine at the end of the procedure ?SPECIMENS:  Uterus, cervix, and tubes sent to pathology ?COMPLICATIONS:  None immediate. ? ?DESCRIPTION OF PROCEDURE:  The patient received intravenous antibiotics and had sequential compression devices applied to her lower extremities while in the preoperative area.  Informed consent was reviewed and signed in Preop area.  ? ?The patient was taken back to the OR where general anesthesia was found to be adequate. The  patient was prepared and draped in the normal sterile fashion in the dorsal lithotomy position in Deputy. A timeout was performed.  ? ?A short weighted speculum was placed into the vagina. Cervix was grasped at 12 and 6 oclock with single tooth tenaculum and double tooth tenaculum respectively. Circumferential colpotomy was made with the Bovie. Vaginal tissue was pushed cephalad with the sponge. I entered posteriorly sharply with mayo scissors. The vagina and posterior peritoneum was tagged with 0 vicryl. Long weighted speculum was then placed in the vagina. I then entered sharply anteriorly using metzenbaum scissors and a right angle retractor. The right angle was then replaced passing it beneath my digit.  ? ?Then I bilaterally clamped the uterosacral ligaments with Z clamps, cut and suture ligated with 0 vicryl. The cardinal ligaments were then bilaterally clamped, cut and suture ligated. Then the uterine arteries were bilaterally clamped, cut and suture ligated the uterine arteries. I then bilaterally clamped the broad ligament connecting the peritoneum anteriorly and posteriorly. The pedicle was cut and suture ligated. I then used single tooth tenaculums to core out some of the fibroids to reduce bulk at the body and upper part of the uterus. I was then able to flip the uterus out. I bilaterally doubly clamped the remaining pedicles. I then cut and removed the uterus and cervix.  The adnexal pedicles were then doubly suture ligated and the clamps removed. The pedicles along the suture line were all inspected and noted to be hemostatic. The tubes were then grasped with babcock clamps bilaterally and removed using the metzenbaum scissors. The pedicles were bilaterally suture ligated. The pedicle site was hemostatic. ? ? ?Short weighted speculum inserted. Starting at the right angle with 0 vicryl, sutured closed the cuff in a running fashion to the midline. Cuff inspected and  hemostatic.  ? ?A catheter was  placed and the urine was noted to be clear of any blood. The catheter was then removed. ? ?At this point the procedure was ended. All sponge, lap, needle and instrument counts were correct x2. The patient was taken to the recovery room in good condition. ABX given at the start of the procedure as well as Toradol. ? ?Radene Gunning, MD, FACOG ?Obstetrician Social research officer, government, Faculty Practice ?[facility] [place] ? ? ?

## 2021-09-27 NOTE — Anesthesia Postprocedure Evaluation (Signed)
Anesthesia Post Note ? ?Patient: Michelle Evans ? ?Procedure(s) Performed: HYSTERECTOMY VAGINAL WITH SALPINGECTOMY (Vagina ) ? ?  ? ?Patient location during evaluation: PACU ?Anesthesia Type: General ?Level of consciousness: awake and alert ?Pain management: pain level controlled ?Vital Signs Assessment: post-procedure vital signs reviewed and stable ?Respiratory status: spontaneous breathing, nonlabored ventilation and respiratory function stable ?Cardiovascular status: blood pressure returned to baseline and stable ?Postop Assessment: no apparent nausea or vomiting ?Anesthetic complications: no ? ? ?No notable events documented. ? ?Last Vitals:  ?Vitals:  ? 09/27/21 1315 09/27/21 1330  ?BP: 135/80 (!) 131/92  ?Pulse: 98 88  ?Resp: 10 16  ?Temp:  37 ?C  ?SpO2: 100% 96%  ?  ?Last Pain:  ?Vitals:  ? 09/27/21 1300  ?TempSrc:   ?PainSc: 0-No pain  ? ? ?  ?  ?  ?  ?  ?  ? ?Oakley Orban,W. EDMOND ? ? ? ? ?

## 2021-09-27 NOTE — Anesthesia Procedure Notes (Signed)
Procedure Name: Intubation ?Date/Time: 09/27/2021 10:44 AM ?Performed by: Bonney Aid, CRNA ?Pre-anesthesia Checklist: Patient identified, Emergency Drugs available, Suction available and Patient being monitored ?Patient Re-evaluated:Patient Re-evaluated prior to induction ?Oxygen Delivery Method: Circle system utilized ?Preoxygenation: Pre-oxygenation with 100% oxygen ?Induction Type: IV induction ?Ventilation: Mask ventilation without difficulty ?Laryngoscope Size: Mac and 3 ?Grade View: Grade I ?Tube type: Oral ?Tube size: 7.0 mm ?Number of attempts: 1 ?Airway Equipment and Method: Stylet ?Placement Confirmation: ETT inserted through vocal cords under direct vision, positive ETCO2 and breath sounds checked- equal and bilateral ?Secured at: 22 cm ?Tube secured with: Tape ?Dental Injury: Teeth and Oropharynx as per pre-operative assessment  ? ? ? ? ?

## 2021-09-27 NOTE — Progress Notes (Signed)
Checked in on patient - she is doing very well and still wishing for discharge. She has minimal bleeding. She is tolerating some liquids and denies nausea. Pain is cramping but currently controlled with PO medications. She is ambulatory. She has not yet voided spontaneously.  ?She got an additional dose of Ancef.  ? ?Vitals:  ? 09/27/21 1300 09/27/21 1315 09/27/21 1330 09/27/21 1349  ?BP: (!) 140/95 135/80 (!) 131/92 133/84  ?Pulse: 90 98 88 76  ?Resp: '10 10 16 16  '$ ?Temp:   98.6 ?F (37 ?C)   ?TempSrc:      ?SpO2: 100% 100% 96% 100%  ?Weight:      ?Height:      ? ? ?Gen: NAD, resting comfortable ?Chest: Normal respiratory effort, normal rate ?Abd: S/NT ? ?Plan: ?- Will await spontaneous void. If voids and desires d/c home, she is doing well and would be okay for discharge and meeting all other goals.  ?- Medications sent to pharmacy and follow up appointment already made ?- Postop precautions and restrictions reviewed with her and family.  ?- All questions answered.  ?- RN will inform me regardless per my request ? ?Radene Gunning, MD ?Attending [place], Faculty Practice ?[facility] [place] ? ? ?

## 2021-09-27 NOTE — Anesthesia Preprocedure Evaluation (Addendum)
Anesthesia Evaluation  ?Patient identified by MRN, date of birth, ID band ?Patient awake ? ? ? ?Reviewed: ?Allergy & Precautions, H&P , NPO status , Patient's Chart, lab work & pertinent test results ? ?Airway ?Mallampati: I ? ?TM Distance: >3 FB ?Neck ROM: Full ? ? ? Dental ?no notable dental hx. ?(+) Teeth Intact, Dental Advisory Given ?  ?Pulmonary ?Current Smoker and Patient abstained from smoking.,  ?  ?Pulmonary exam normal ?breath sounds clear to auscultation ? ? ? ? ? ? Cardiovascular ?hypertension, Pt. on medications ? ?Rhythm:Regular Rate:Normal ? ? ?  ?Neuro/Psych ?Depression negative neurological ROS ?   ? GI/Hepatic ?negative GI ROS, Neg liver ROS,   ?Endo/Other  ?negative endocrine ROS ? Renal/GU ?negative Renal ROS  ?negative genitourinary ?  ?Musculoskeletal ? ? Abdominal ?  ?Peds ? Hematology ? ?(+) Blood dyscrasia, anemia ,   ?Anesthesia Other Findings ? ? Reproductive/Obstetrics ?negative OB ROS ? ?  ? ? ? ? ? ? ? ? ? ? ? ? ? ?  ?  ? ? ? ? ? ? ? ?Anesthesia Physical ?Anesthesia Plan ? ?ASA: 2 ? ?Anesthesia Plan: General  ? ?Post-op Pain Management: Tylenol PO (pre-op)*  ? ?Induction: Intravenous ? ?PONV Risk Score and Plan: 3 and Ondansetron, Dexamethasone and Midazolam ? ?Airway Management Planned: Oral ETT ? ?Additional Equipment:  ? ?Intra-op Plan:  ? ?Post-operative Plan: Extubation in OR ? ?Informed Consent: I have reviewed the patients History and Physical, chart, labs and discussed the procedure including the risks, benefits and alternatives for the proposed anesthesia with the patient or authorized representative who has indicated his/her understanding and acceptance.  ? ? ? ?Dental advisory given ? ?Plan Discussed with: CRNA ? ?Anesthesia Plan Comments:   ? ? ? ? ? ? ?Anesthesia Quick Evaluation ? ?

## 2021-09-27 NOTE — Transfer of Care (Signed)
Immediate Anesthesia Transfer of Care Note ? ?Patient: Michelle Evans ? ?Procedure(s) Performed: HYSTERECTOMY VAGINAL WITH SALPINGECTOMY (Vagina ) ? ?Patient Location: PACU ? ?Anesthesia Type:General ? ?Level of Consciousness: drowsy ? ?Airway & Oxygen Therapy: Patient Spontanous Breathing and Patient connected to nasal cannula oxygen ? ?Post-op Assessment: Report given to RN ? ?Post vital signs: Reviewed and stable ? ?Last Vitals:  ?Vitals Value Taken Time  ?BP 140/94 09/27/21 1233  ?Temp    ?Pulse 98   ?Resp 17 09/27/21 1234  ?SpO2 100   ?Vitals shown include unvalidated device data. ? ?Last Pain:  ?Vitals:  ? 09/27/21 0849  ?TempSrc: Oral  ?PainSc: 0-No pain  ?   ? ?Patients Stated Pain Goal: 7 (09/27/21 0849) ? ?Complications: No notable events documented. ?

## 2021-09-27 NOTE — Interval H&P Note (Signed)
History and Physical Interval Note: ? ?09/27/2021 ?10:26 AM ? ?Michelle Evans  has presented today for surgery, with the diagnosis of Menorrhagia.  The various methods of treatment have been discussed with the patient and family. After consideration of risks, benefits and other options for treatment, the patient has consented to  Procedure(s): ?HYSTERECTOMY VAGINAL WITH SALPINGECTOMY (N/A) as a surgical intervention.  The patient's history has been reviewed, patient examined, no change in status, stable for surgery.  I have reviewed the patient's chart and labs.  Questions were answered to the patient's satisfaction.   ? ? ?Radene Gunning ? ? ?

## 2021-09-28 ENCOUNTER — Telehealth: Payer: Self-pay | Admitting: Obstetrics and Gynecology

## 2021-09-28 ENCOUNTER — Encounter (HOSPITAL_BASED_OUTPATIENT_CLINIC_OR_DEPARTMENT_OTHER): Payer: Self-pay | Admitting: Obstetrics and Gynecology

## 2021-09-28 LAB — SURGICAL PATHOLOGY

## 2021-09-28 NOTE — Telephone Encounter (Signed)
Called pt - POD1 s/p TVH, b/l salpingectomy at home. She notes cramping but otherwise doing well. Bleeding has stopped, only pink with wiping. She is voiding well. Has not yet BM. Took some gas x and miralax - passing gas. She doesn't need anything at this time.  ? ?Reviewed pathology.  ? ?She will call or msg if any concerns or questions between now and postop.  ? ?Radene Gunning, MD ?Attending [place], Faculty Practice ?[facility] ? ? ?

## 2021-10-03 ENCOUNTER — Encounter: Payer: Self-pay | Admitting: Gastroenterology

## 2021-10-04 ENCOUNTER — Other Ambulatory Visit (HOSPITAL_BASED_OUTPATIENT_CLINIC_OR_DEPARTMENT_OTHER): Payer: Self-pay

## 2021-10-10 ENCOUNTER — Encounter: Payer: Self-pay | Admitting: Obstetrics and Gynecology

## 2021-10-12 ENCOUNTER — Other Ambulatory Visit (HOSPITAL_BASED_OUTPATIENT_CLINIC_OR_DEPARTMENT_OTHER): Payer: Self-pay

## 2021-10-12 MED ORDER — NICOTINE 21 MG/24HR TD PT24
MEDICATED_PATCH | TRANSDERMAL | 3 refills | Status: DC
  Filled 2021-10-12: qty 28, 28d supply, fill #0

## 2021-10-13 ENCOUNTER — Other Ambulatory Visit (HOSPITAL_BASED_OUTPATIENT_CLINIC_OR_DEPARTMENT_OTHER): Payer: Self-pay

## 2021-10-17 ENCOUNTER — Other Ambulatory Visit (HOSPITAL_BASED_OUTPATIENT_CLINIC_OR_DEPARTMENT_OTHER): Payer: Self-pay

## 2021-10-19 ENCOUNTER — Other Ambulatory Visit (HOSPITAL_BASED_OUTPATIENT_CLINIC_OR_DEPARTMENT_OTHER): Payer: Self-pay

## 2021-10-21 ENCOUNTER — Other Ambulatory Visit: Payer: Self-pay

## 2021-10-21 ENCOUNTER — Other Ambulatory Visit (HOSPITAL_BASED_OUTPATIENT_CLINIC_OR_DEPARTMENT_OTHER): Payer: Self-pay

## 2021-10-21 ENCOUNTER — Encounter: Payer: Self-pay | Admitting: Obstetrics and Gynecology

## 2021-10-21 DIAGNOSIS — B379 Candidiasis, unspecified: Secondary | ICD-10-CM

## 2021-10-21 MED ORDER — FLUCONAZOLE 150 MG PO TABS
ORAL_TABLET | ORAL | 1 refills | Status: DC
  Filled 2021-10-21: qty 2, 3d supply, fill #0

## 2021-10-24 ENCOUNTER — Other Ambulatory Visit (HOSPITAL_BASED_OUTPATIENT_CLINIC_OR_DEPARTMENT_OTHER): Payer: Self-pay

## 2021-11-01 ENCOUNTER — Other Ambulatory Visit (HOSPITAL_BASED_OUTPATIENT_CLINIC_OR_DEPARTMENT_OTHER): Payer: Self-pay

## 2021-11-01 ENCOUNTER — Other Ambulatory Visit: Payer: Self-pay | Admitting: Physician Assistant

## 2021-11-01 MED ORDER — MOMETASONE FUROATE 0.1 % EX OINT
TOPICAL_OINTMENT | CUTANEOUS | 0 refills | Status: DC
  Filled 2021-11-01: qty 45, 30d supply, fill #0

## 2021-11-02 ENCOUNTER — Other Ambulatory Visit (HOSPITAL_BASED_OUTPATIENT_CLINIC_OR_DEPARTMENT_OTHER): Payer: Self-pay

## 2021-11-02 MED ORDER — VYVANSE 70 MG PO CAPS
ORAL_CAPSULE | ORAL | 0 refills | Status: DC
  Filled 2021-11-02: qty 30, 30d supply, fill #0

## 2021-11-14 ENCOUNTER — Other Ambulatory Visit (HOSPITAL_BASED_OUTPATIENT_CLINIC_OR_DEPARTMENT_OTHER): Payer: Self-pay

## 2021-11-14 MED ORDER — CARESTART COVID-19 HOME TEST VI KIT
PACK | 0 refills | Status: DC
  Filled 2021-11-14: qty 8, 8d supply, fill #0

## 2021-11-21 ENCOUNTER — Ambulatory Visit (INDEPENDENT_AMBULATORY_CARE_PROVIDER_SITE_OTHER): Payer: 59 | Admitting: Obstetrics and Gynecology

## 2021-11-21 ENCOUNTER — Encounter: Payer: Self-pay | Admitting: Obstetrics and Gynecology

## 2021-11-21 VITALS — BP 140/78 | HR 83 | Wt 190.0 lb

## 2021-11-21 DIAGNOSIS — Z09 Encounter for follow-up examination after completed treatment for conditions other than malignant neoplasm: Secondary | ICD-10-CM

## 2021-11-21 NOTE — Progress Notes (Signed)
? ?  GYNECOLOGY OFFICE VISIT NOTE ? ?History:  ? Michelle Evans is a 46 y.o. [id number] here today for postop check. She is s/p TVH, bilateral salpingectomy 3/7 which proceeded without complication. Pathology was benign but showed fibroids as expected. ? ?She denies any abnormal vaginal discharge, bleeding, pelvic pain or other concerns. ?  ?Past Medical History:  ?Diagnosis Date  ? Abnormal Pap smear   ? 18 yrs ago  cone biopsy  ? Acute upper respiratory infections of other multiple sites   ? ADD (attention deficit disorder)   ? Anemia   ? 1990's  ? Dysmenorrhea   ? Fibroid tumor   ? Hypertension   ? Mild reactive airways disease   ? Follows with Dr. Iran Planas PCP @ [facility]  ? Wears contact lenses   ? Wears glasses   ? Wheezing   ? ? ?Past Surgical History:  ?Procedure Laterality Date  ? CERVICAL CONE BIOPSY  1994  ? VAGINAL HYSTERECTOMY N/A 09/27/2021  ? Procedure: HYSTERECTOMY VAGINAL WITH SALPINGECTOMY;  Surgeon: Radene Gunning, MD;  Location: [facility]  Service: Gynecology;  Laterality: N/A;  ? ? ?The following portions of the patient's history were reviewed and updated as appropriate: allergies, current medications, past family history, past medical history, past social history, past surgical history and problem list.  ? ?Review of Systems:  ?Pertinent items noted in HPI and remainder of comprehensive ROS otherwise negative. ? ?Physical Exam:  ?There were no vitals taken for this visit. ?CONSTITUTIONAL: Well-developed, well-nourished female in no acute distress.  ?HEENT:  Normocephalic, atraumatic. External right and left ear normal. No scleral icterus.  ?NECK: Normal range of motion, supple, no masses noted on observation ?SKIN: No rash noted. Not diaphoretic. No erythema. No pallor. ?MUSCULOSKELETAL: Normal range of motion. No edema noted. ?NEUROLOGIC: Alert and oriented to person, place, and time. Normal muscle tone coordination. No cranial nerve deficit noted. ?PSYCHIATRIC:  Normal mood and affect. Normal behavior. Normal judgment and thought content. ? ?CARDIOVASCULAR: Normal heart rate noted ?RESPIRATORY: Effort and breath sounds normal, no problems with respiration noted ?ABDOMEN: No masses noted. No other overt distention noted.   ? ?PELVIC:  vaginal cuff healing well ? ?Labs and Imaging ?No results found for this or any previous visit (from the past 168 hour(s)). ?No results found.  ?Assessment and Plan:  ? 1. Postop check ?- She is almost 2 months s/p surgery. Reviewed all restrictions lifted except intercourse for another 4 weeks as a precaution to avoid cuff dehiscence.  ? ? ?Routine preventative health maintenance measures emphasized. ?Please refer to After Visit Summary for other counseling recommendations.  ? ?No follow-ups on file. ? ?Radene Gunning, MD, FACOG ?Obstetrician Social research officer, government, Faculty Practice ?[facility] [place] ? ? ? ? ? ?

## 2021-11-21 NOTE — Addendum Note (Signed)
Addended by: Radene Gunning A on: 11/21/2021 11:39 AM ? ? Modules accepted: Level of Service ? ?

## 2021-11-25 ENCOUNTER — Other Ambulatory Visit: Payer: Self-pay | Admitting: Physician Assistant

## 2021-11-25 ENCOUNTER — Other Ambulatory Visit (HOSPITAL_BASED_OUTPATIENT_CLINIC_OR_DEPARTMENT_OTHER): Payer: Self-pay

## 2021-11-25 MED ORDER — MOMETASONE FUROATE 0.1 % EX OINT
TOPICAL_OINTMENT | CUTANEOUS | 0 refills | Status: AC
  Filled 2021-11-25: qty 45, 30d supply, fill #0

## 2021-11-28 ENCOUNTER — Other Ambulatory Visit (HOSPITAL_BASED_OUTPATIENT_CLINIC_OR_DEPARTMENT_OTHER): Payer: Self-pay

## 2021-11-30 ENCOUNTER — Other Ambulatory Visit (HOSPITAL_BASED_OUTPATIENT_CLINIC_OR_DEPARTMENT_OTHER): Payer: Self-pay

## 2021-11-30 ENCOUNTER — Other Ambulatory Visit: Payer: Self-pay | Admitting: Physician Assistant

## 2021-12-01 ENCOUNTER — Other Ambulatory Visit (HOSPITAL_BASED_OUTPATIENT_CLINIC_OR_DEPARTMENT_OTHER): Payer: Self-pay

## 2021-12-01 MED ORDER — ALBUTEROL SULFATE HFA 108 (90 BASE) MCG/ACT IN AERS
INHALATION_SPRAY | RESPIRATORY_TRACT | 2 refills | Status: DC
  Filled 2021-12-01: qty 6.7, 25d supply, fill #0
  Filled 2022-06-21: qty 6.7, 25d supply, fill #1

## 2021-12-06 ENCOUNTER — Other Ambulatory Visit: Payer: Self-pay | Admitting: Physician Assistant

## 2021-12-06 ENCOUNTER — Other Ambulatory Visit (HOSPITAL_BASED_OUTPATIENT_CLINIC_OR_DEPARTMENT_OTHER): Payer: Self-pay

## 2021-12-08 ENCOUNTER — Other Ambulatory Visit (HOSPITAL_BASED_OUTPATIENT_CLINIC_OR_DEPARTMENT_OTHER): Payer: Self-pay

## 2022-01-03 ENCOUNTER — Other Ambulatory Visit (HOSPITAL_BASED_OUTPATIENT_CLINIC_OR_DEPARTMENT_OTHER): Payer: Self-pay

## 2022-01-04 ENCOUNTER — Other Ambulatory Visit (HOSPITAL_BASED_OUTPATIENT_CLINIC_OR_DEPARTMENT_OTHER): Payer: Self-pay

## 2022-02-03 ENCOUNTER — Ambulatory Visit: Payer: 59 | Admitting: Physician Assistant

## 2022-02-03 ENCOUNTER — Other Ambulatory Visit (HOSPITAL_BASED_OUTPATIENT_CLINIC_OR_DEPARTMENT_OTHER): Payer: Self-pay

## 2022-02-03 ENCOUNTER — Encounter: Payer: Self-pay | Admitting: Physician Assistant

## 2022-02-03 VITALS — BP 136/72 | HR 66 | Wt 187.0 lb

## 2022-02-03 DIAGNOSIS — L723 Sebaceous cyst: Secondary | ICD-10-CM | POA: Diagnosis not present

## 2022-02-03 DIAGNOSIS — F17200 Nicotine dependence, unspecified, uncomplicated: Secondary | ICD-10-CM

## 2022-02-03 DIAGNOSIS — L089 Local infection of the skin and subcutaneous tissue, unspecified: Secondary | ICD-10-CM

## 2022-02-03 MED ORDER — DOXYCYCLINE HYCLATE 100 MG PO TABS
100.0000 mg | ORAL_TABLET | Freq: Two times a day (BID) | ORAL | 0 refills | Status: DC
  Filled 2022-02-03: qty 20, 10d supply, fill #0

## 2022-02-03 MED ORDER — FLUCONAZOLE 150 MG PO TABS
150.0000 mg | ORAL_TABLET | Freq: Once | ORAL | 0 refills | Status: AC
  Filled 2022-02-03: qty 2, 2d supply, fill #0

## 2022-02-03 MED ORDER — BUPROPION HCL ER (SR) 100 MG PO TB12
100.0000 mg | ORAL_TABLET | Freq: Two times a day (BID) | ORAL | 0 refills | Status: DC
  Filled 2022-02-03: qty 60, 30d supply, fill #0

## 2022-02-03 NOTE — Patient Instructions (Addendum)
Managing the Challenge of Quitting Smoking Quitting smoking is a physical and mental challenge. You may have cravings, withdrawal symptoms, and temptation to smoke. Before quitting, work with your health care provider to make a plan that can help you manage quitting. Making a plan before you quit may keep you from smoking when you have the urge to smoke while trying to quit. How to manage lifestyle changes Managing stress Stress can make you want to smoke, and wanting to smoke may cause stress. It is important to find ways to manage your stress. You could try some of the following: Practice relaxation techniques. Breathe slowly and deeply, in through your nose and out through your mouth. Listen to music. Soak in a bath or take a shower. Imagine a peaceful place or vacation. Get some support. Talk with family or friends about your stress. Join a support group. Talk with a counselor or therapist. Get some physical activity. Go for a walk, run, or bike ride. Play a favorite sport. Practice yoga.  Medicines Talk with your health care provider about medicines that might help you deal with cravings and make quitting easier for you. Relationships Social situations can be difficult when you are quitting smoking. To manage this, you can: Avoid parties and other social situations where people might be smoking. Avoid alcohol. Leave right away if you have the urge to smoke. Explain to your family and friends that you are quitting smoking. Ask for support and let them know you might be a bit grumpy. Plan activities where smoking is not an option. General instructions Be aware that many people gain weight after they quit smoking. However, not everyone does. To keep from gaining weight, have a plan in place before you quit, and stick to the plan after you quit. Your plan should include: Eating healthy snacks. When you have a craving, it may help to: Eat popcorn, or try carrots, celery, or other  cut vegetables. Chew sugar-free gum. Changing how you eat. Eat small portion sizes at meals. Eat 4-6 small meals throughout the day instead of 1-2 large meals a day. Be mindful when you eat. You should avoid watching television or doing other things that might distract you as you eat. Exercising regularly. Make time to exercise each day. If you do not have time for a long workout, do short bouts of exercise for 5-10 minutes several times a day. Do some form of strengthening exercise, such as weight lifting. Do some exercise that gets your heart beating and causes you to breathe deeply, such as walking fast, running, swimming, or biking. This is very important. Drinking plenty of water or other low-calorie or no-calorie drinks. Drink enough fluid to keep your urine pale yellow.  How to recognize withdrawal symptoms Your body and mind may experience discomfort as you try to get used to not having nicotine in your system. These effects are called withdrawal symptoms. They may include: Feeling hungrier than normal. Having trouble concentrating. Feeling irritable or restless. Having trouble sleeping. Feeling depressed. Craving a cigarette. These symptoms may surprise you, but they are normal to have when quitting smoking. To manage withdrawal symptoms: Avoid places, people, and activities that trigger your cravings. Remember why you want to quit. Get plenty of sleep. Avoid coffee and other drinks that contain caffeine. These may worsen some of your symptoms. How to manage cravings Come up with a plan for how to deal with your cravings. The plan should include the following: A definition of the specific  situation you want to deal with. An activity or action you will take to replace smoking. A clear idea for how this action will help. The name of someone who could help you with this. Cravings usually last for 5-10 minutes. Consider taking the following actions to help you with your plan to  deal with cravings: Keep your mouth busy. Chew sugar-free gum. Suck on hard candies or a straw. Brush your teeth. Keep your hands and body busy. Change to a different activity right away. Squeeze or play with a ball. Do an activity or a hobby, such as making bead jewelry, practicing needlepoint, or working with wood. Mix up your normal routine. Take a short exercise break. Go for a quick walk, or run up and down stairs. Focus on doing something kind or helpful for someone else. Call a friend or family member to talk during a craving. Join a support group. Contact a quitline. Where to find support To get help or find a support group: Call the [facility]'s Smoking Quitline: 1-800-QUIT-NOW 614-077-7176) Text QUIT to SmokefreeTXT: 762831 Where to find more information Visit these websites to find more information on quitting smoking: U.S. Department of Health and Human Services: www.smokefree.gov American Lung Association: www.freedomfromsmoking.org Centers for Disease Control and Prevention (CDC): http://www.wolf.info/ American Heart Association: www.heart.org Contact a health care provider if: You want to change your plan for quitting. The medicines you are taking are not helping. Your eating feels out of control or you cannot sleep. You feel depressed or become very anxious. Summary Quitting smoking is a physical and mental challenge. You will face cravings, withdrawal symptoms, and temptation to smoke again. Preparation can help you as you go through these challenges. Try different techniques to manage stress, handle social situations, and prevent weight gain. You can deal with cravings by keeping your mouth busy (such as by chewing gum), keeping your hands and body busy, calling family or friends, or contacting a quitline for people who want to quit smoking. You can deal with withdrawal symptoms by avoiding places where people smoke, getting plenty of rest, and avoiding drinks  that contain caffeine. This information is not intended to replace advice given to you by your health care provider. Make sure you discuss any questions you have with your health care provider. Document Revised: 07/01/2021 Document Reviewed: 07/01/2021 Elsevier Patient Education  [place]. Epidermoid Cyst  An epidermoid cyst, also known as epidermal cyst, is a sac made of skin tissue. The sac contains a substance called keratin. Keratin is a protein that is normally secreted through the hair follicles. When keratin becomes trapped in the top layer of skin (epidermis), it can form an epidermoid cyst. Epidermoid cysts can be found anywhere on your body. These cysts are usually harmless (benign), and they may not cause symptoms unless they become inflamed or infected. What are the causes? This condition may be caused by: A blocked hair follicle. A hair that curls and re-enters the skin instead of growing straight out of the skin (ingrown hair). A blocked pore. Irritated skin. An injury to the skin. Certain conditions that are passed along from parent to child (inherited). Human papillomavirus (HPV). This happens rarely when cysts occur on the bottom of the feet. Long-term (chronic) sun damage to the skin. What increases the risk? The following factors may make you more likely to develop an epidermoid cyst: Having acne. Being female. Having an injury to the skin. Being past puberty. Having certain rare genetic disorders. What are  the signs or symptoms? The only symptom of this condition may be a small, painless lump underneath the skin. When an epidermal cyst ruptures, it may become inflamed. True infection in cysts is rare. Symptoms may include: Redness. Inflammation. Tenderness. Warmth. Keratin draining from the cyst. Keratin is grayish-white, bad-smelling substance. Pus draining from the cyst. How is this diagnosed? This condition is diagnosed with a physical exam. In some  cases, you may have a sample of tissue (biopsy) taken from your cyst to be examined under a microscope or tested for bacteria. You may be referred to a health care provider who specializes in skin care (dermatologist). How is this treated? If a cyst becomes inflamed, treatment may include: Opening and draining the cyst, done by a health care provider. After draining, minor surgery to remove the rest of the cyst may be done. Taking antibiotic medicine. Having injections of medicines (steroids) that help to reduce inflammation. Having surgery to remove the cyst. Surgery may be done if the cyst: Becomes large. Bothers you. Has a chance of turning into cancer. Do not try to open a cyst yourself. Follow these instructions at home: Medicines If you were prescribed an antibiotic medicine, take it it as told by your health care provider. Do not stop using the antibiotic even if you start to feel better. Take over-the-counter and prescription medicines only as told by your health care provider. General instructions Keep the area around your cyst clean and dry. Wear loose, dry clothing. Avoid touching your cyst. Check your cyst every day for signs of infection. Check for: Redness, swelling, or pain. Fluid or blood. Warmth. Pus or a bad smell. Keep all follow-up visits. This is important. How is this prevented? Wear clean, dry, clothing. Avoid wearing tight clothing. Keep your skin clean and dry. Take showers or baths every day. Contact a health care provider if: Your cyst develops symptoms of infection. Your condition is not improving or is getting worse. You develop a cyst that looks different from other cysts you have had. You have a fever. Get help right away if: Redness spreads from the cyst into the surrounding area. Summary An epidermoid cyst is a sac made of skin tissue. These cysts are usually harmless (benign), and they may not cause symptoms unless they become inflamed. If a  cyst becomes inflamed, treatment may include surgery to open and drain the cyst, or to remove it. Treatment may also include medicines by mouth or through an injection. Take over-the-counter and prescription medicines only as told by your health care provider. If you were prescribed an antibiotic medicine, take it as told by your health care provider. Do not stop using the antibiotic even if you start to feel better. Contact a health care provider if your condition is not improving or is getting worse. Keep all follow-up visits as told by your health care provider. This is important. This information is not intended to replace advice given to you by your health care provider. Make sure you discuss any questions you have with your health care provider. Document Revised: 10/15/2019 Document Reviewed: 10/15/2019 Elsevier Patient Education  [place].

## 2022-02-03 NOTE — Progress Notes (Signed)
Acute Office Visit  Subjective:     Patient ID: Michelle Evans, female    DOB: 09-30-1975, 46 y.o.   MRN: [id number]  Chief Complaint  Patient presents with   Recurrent Skin Infections    HPI Patient is in today for a painful lump in left axilla for the last week. She has not had anything like this before. It is not draining. She has put some topical bactroban on it and helped a little. No fever, chills, body aches. It feels like today it is getting a little bigger.   She is a current smoker trying to quit. She is down to 5 cigs a day.   .. Active Ambulatory Problems    Diagnosis Date Noted   History of cervical dysplasia 09/20/2011   Anemia 09/20/2011   Menorrhagia 09/20/2011   Other malaise and fatigue 02/24/2013   Overweight 02/24/2013   Tobacco use disorder 02/24/2013   Candidiasis of female genitalia 09/18/2013   Nasal congestion 09/18/2013   Sinus infection 09/18/2013   Cough 09/18/2013   Attention deficit disorder (ADD) without hyperactivity 08/17/2017   Premenstrual dysphoric disorder 08/17/2015   Primary hypertension 08/10/2021   H/O: hysterectomy 09/27/2021   Resolved Ambulatory Problems    Diagnosis Date Noted   Fibroid    Past Medical History:  Diagnosis Date   Abnormal Pap smear    Acute upper respiratory infections of other multiple sites    ADD (attention deficit disorder)    Dysmenorrhea    Fibroid tumor    Hypertension    Mild reactive airways disease    Wears contact lenses    Wears glasses    Wheezing      ROS See HPI.      Objective:    BP 136/72   Pulse 66   Wt 187 lb (84.8 kg)   BMI 28.43 kg/m  BP Readings from Last 3 Encounters:  02/03/22 136/72  11/21/21 140/78  09/27/21 (!) 132/97      Physical Exam Constitutional:      Appearance: Normal appearance.  HENT:     Head: Normocephalic.  Cardiovascular:     Rate and Rhythm: Normal rate.  Pulmonary:     Effort: Pulmonary effort is normal.  Skin:    Comments: Left  axilla at insertion of breast tissue walnut size tender firm superficial mass. No drainage. Warm and painful to touch.   Neurological:     General: No focal deficit present.     Mental Status: She is alert and oriented to person, place, and time.  Psychiatric:        Mood and Affect: Mood normal.          Assessment & Plan:  Marland KitchenMarland KitchenValoree was seen today for recurrent skin infections.  Diagnoses and all orders for this visit:  Infected sebaceous cyst -     doxycycline (VIBRA-TABS) 100 MG tablet; Take 1 tablet (100 mg total) by mouth 2 (two) times daily.  Current smoker -     buPROPion ER (WELLBUTRIN SR) 100 MG 12 hr tablet; Take 1 tablet (100 mg total) by mouth 2 (two) times daily.  Other orders -     fluconazole (DIFLUCAN) 150 MG tablet; Take 1 tablet (150 mg total) by mouth once for 1 dose. Repeat in 48-72 hours if symptoms persist.  Discussed infected sebaceous cyst and causes HO given Start warm compresses at least 3 times a day Start doxycycline  Follow up as needed or if symptoms worsening Diflucan for  yeast infection after abx Smoking cessation can help with these as well Start wellbutrin bid Goal to cut back to 2 cigs a day in next month Follow up in 1 month  Return in about 4 weeks (around 03/03/2022) for at [facility] .  Iran Planas, PA-C

## 2022-02-28 ENCOUNTER — Ambulatory Visit (INDEPENDENT_AMBULATORY_CARE_PROVIDER_SITE_OTHER): Payer: 59 | Admitting: Physician Assistant

## 2022-02-28 ENCOUNTER — Other Ambulatory Visit (HOSPITAL_BASED_OUTPATIENT_CLINIC_OR_DEPARTMENT_OTHER): Payer: Self-pay

## 2022-02-28 ENCOUNTER — Encounter: Payer: Self-pay | Admitting: Physician Assistant

## 2022-02-28 VITALS — BP 146/88 | HR 87 | Ht 68.0 in | Wt 188.0 lb

## 2022-02-28 DIAGNOSIS — Z0000 Encounter for general adult medical examination without abnormal findings: Secondary | ICD-10-CM

## 2022-02-28 DIAGNOSIS — Z1159 Encounter for screening for other viral diseases: Secondary | ICD-10-CM | POA: Diagnosis not present

## 2022-02-28 DIAGNOSIS — Z1329 Encounter for screening for other suspected endocrine disorder: Secondary | ICD-10-CM | POA: Diagnosis not present

## 2022-02-28 DIAGNOSIS — F17200 Nicotine dependence, unspecified, uncomplicated: Secondary | ICD-10-CM | POA: Diagnosis not present

## 2022-02-28 DIAGNOSIS — Z13220 Encounter for screening for lipoid disorders: Secondary | ICD-10-CM | POA: Diagnosis not present

## 2022-02-28 DIAGNOSIS — E538 Deficiency of other specified B group vitamins: Secondary | ICD-10-CM | POA: Diagnosis not present

## 2022-02-28 DIAGNOSIS — D649 Anemia, unspecified: Secondary | ICD-10-CM

## 2022-02-28 DIAGNOSIS — I10 Essential (primary) hypertension: Secondary | ICD-10-CM

## 2022-02-28 MED ORDER — LISDEXAMFETAMINE DIMESYLATE 70 MG PO CAPS
70.0000 mg | ORAL_CAPSULE | Freq: Every morning | ORAL | 0 refills | Status: DC
  Filled 2022-02-28 (×2): qty 90, 90d supply, fill #0
  Filled ????-??-??: fill #0

## 2022-02-28 MED ORDER — WEGOVY 1.7 MG/0.75ML ~~LOC~~ SOAJ
1.7000 mg | SUBCUTANEOUS | 0 refills | Status: DC
  Filled 2022-02-28: qty 3, 28d supply, fill #0

## 2022-02-28 MED ORDER — BUPROPION HCL ER (SR) 150 MG PO TB12
150.0000 mg | ORAL_TABLET | Freq: Two times a day (BID) | ORAL | 1 refills | Status: DC
  Filled 2022-02-28: qty 180, 90d supply, fill #0
  Filled 2022-08-30: qty 180, 90d supply, fill #1

## 2022-02-28 MED ORDER — WEGOVY 2.4 MG/0.75ML ~~LOC~~ SOAJ
2.4000 mg | SUBCUTANEOUS | 2 refills | Status: DC
  Filled 2022-02-28 – 2022-03-24 (×2): qty 3, 28d supply, fill #0

## 2022-02-28 MED ORDER — HYDROCHLOROTHIAZIDE 25 MG PO TABS
25.0000 mg | ORAL_TABLET | Freq: Every day | ORAL | 3 refills | Status: DC
  Filled 2022-02-28: qty 90, 90d supply, fill #0
  Filled 2022-05-31: qty 90, 90d supply, fill #1
  Filled 2022-08-30: qty 90, 90d supply, fill #2
  Filled 2022-12-06: qty 90, 90d supply, fill #3

## 2022-02-28 NOTE — Patient Instructions (Signed)

## 2022-02-28 NOTE — Progress Notes (Signed)
Complete physical exam  Patient: Michelle Evans   DOB: Jun 26, 1976   46 y.o. Female  MRN: [id number]  Subjective:    Chief Complaint  Patient presents with   Annual Exam    Michelle Evans is a 46 y.o. female who presents today for a complete physical exam. She reports consuming a general diet. Home exercise routine includes walking. She generally feels well. She reports sleeping well. She does not have additional problems to discuss today.    Most recent fall risk assessment:    02/28/2022    1:46 PM  [place] in the past year? 1  Number falls in past yr: 0  Injury with Fall? 0  Risk for fall due to : No Fall Risks  Follow up Falls evaluation completed     Most recent depression screenings:    02/28/2022    2:17 PM 02/28/2022    1:47 PM  PHQ 2/9 Scores  PHQ - 2 Score 0 0  PHQ- 9 Score 3     Vision:Within last year and Dental: No current dental problems and Receives regular dental care  Patient Active Problem List   Diagnosis Date Noted   H/O: hysterectomy 09/27/2021   Primary hypertension 08/10/2021   Attention deficit disorder (ADD) without hyperactivity 08/17/2017   Premenstrual dysphoric disorder 08/17/2015   Candidiasis of female genitalia 09/18/2013   Nasal congestion 09/18/2013   Sinus infection 09/18/2013   Cough 09/18/2013   Other malaise and fatigue 02/24/2013   Overweight 02/24/2013   Tobacco use disorder 02/24/2013   History of cervical dysplasia 09/20/2011   Anemia 09/20/2011   Menorrhagia 09/20/2011   Past Medical History:  Diagnosis Date   Abnormal Pap smear    18 yrs ago  cone biopsy   Acute upper respiratory infections of other multiple sites    ADD (attention deficit disorder)    Anemia    1990's   Dysmenorrhea    Fibroid tumor    Hypertension    Mild reactive airways disease    Follows with Dr. Iran Planas PCP @ [facility]   Wears contact lenses    Wears glasses    Wheezing    Family History  Problem Relation  Age of Onset   Breast cancer Mother 44   Hypertension Mother    Ovarian cancer Mother 85   Diabetes Maternal Grandmother    Hypertension Maternal Grandmother    Heart disease Maternal Grandmother    Cancer Maternal Grandfather        colon and prostate   CVA Maternal Grandfather    Colon polyps Neg Hx    Colon cancer Neg Hx    Esophageal cancer Neg Hx    Stomach cancer Neg Hx    Rectal cancer Neg Hx    Allergies  Allergen Reactions   Theophylline Hives, Itching and Swelling      Patient Care Team: Lavada Mesi as PCP - General (Family Medicine)   Outpatient Medications Prior to Visit  Medication Sig   albuterol (PROAIR HFA) 108 (90 Base) MCG/ACT inhaler INHALE 2 PUFFS INTO THE LUNGS EVERY 6 HOURS AS NEEDED FOR WHEEZING   AMBULATORY NON FORMULARY MEDICATION Medication Name: Nitroglycerine 0.125 % apply pea size to affected area twice per day2 for 6-8 weeks   Cyanocobalamin (B-12 COMPLIANCE INJECTION) 1000 MCG/ML KIT Inject 1 mL as directed every 30 (thirty) days.   fluticasone (FLONASE) 50 MCG/ACT nasal spray Spray 2 sprays in each nostril  daily. (Patient taking differently: Place into both nostrils. As needed)   ibuprofen (ADVIL) 800 MG tablet Take 1 tablet (800 mg total) by mouth 3 (three) times daily with meals as needed for headache, moderate pain or cramping.   LINZESS 145 MCG CAPS capsule TAKE 1 CAPSULE BY MOUTH ONCE DAILY   montelukast (SINGULAIR) 10 MG tablet TAKE 1 TABLET BY MOUTH EVERY NIGHT (Patient taking differently: Take by mouth as needed.)   Multiple Vitamin (MULTIVITAMIN WITH MINERALS) TABS tablet Take 1 tablet by mouth daily.   OVER THE COUNTER MEDICATION Take 1 capsule by mouth daily. Now Probiotic-10   Probiotic Product (PROBIOTIC ADVANCED PO) Take by mouth.   Probiotic, Lactobacillus, CAPS Take by mouth.   [DISCONTINUED] buPROPion ER (WELLBUTRIN SR) 100 MG 12 hr tablet Take 1 tablet (100 mg total) by mouth 2 (two) times daily.   [DISCONTINUED]  hydrochlorothiazide (HYDRODIURIL) 25 MG tablet TAKE 1 TABLET BY MOUTH ONCE DAILY   [DISCONTINUED] lisdexamfetamine (VYVANSE) 70 MG capsule Take 1 capsule (70 mg total) by mouth every morning.   [DISCONTINUED] Semaglutide, 1 MG/DOSE, (OZEMPIC, 1 MG/DOSE,) 4 MG/3ML SOPN Inject 1 mg into the skin once a week.   [DISCONTINUED] doxycycline (VIBRA-TABS) 100 MG tablet Take 1 tablet (100 mg total) by mouth 2 (two) times daily.   No facility-administered medications prior to visit.    Review of Systems  All other systems reviewed and are negative.         Objective:     BP (!) 146/88   Pulse 87   Ht '5\' 8"'  (1.727 m)   Wt 188 lb (85.3 kg)   BMI 28.59 kg/m  BP Readings from Last 3 Encounters:  02/28/22 (!) 146/88  02/03/22 136/72  11/21/21 140/78   Wt Readings from Last 3 Encounters:  02/28/22 188 lb (85.3 kg)  02/03/22 187 lb (84.8 kg)  11/21/21 190 lb (86.2 kg)      Physical Exam     BP (!) 146/88   Pulse 87   Ht '5\' 8"'  (1.727 m)   Wt 188 lb (85.3 kg)   BMI 28.59 kg/m   General Appearance:    Alert, cooperative, no distress, appears stated age  Head:    Normocephalic, without obvious abnormality, atraumatic  Eyes:    PERRL, conjunctiva/corneas clear, EOM's intact, fundi    benign, both eyes  Ears:    Normal TM's and external ear canals, both ears  Nose:   Nares normal, septum midline, mucosa normal, no drainage    or sinus tenderness  Throat:   Lips, mucosa, and tongue normal; teeth and gums normal  Neck:   Supple, symmetrical, trachea midline, no adenopathy;    thyroid:  no enlargement/tenderness/nodules; no carotid   bruit or JVD  Back:     Symmetric, no curvature, ROM normal, no CVA tenderness  Lungs:     Clear to auscultation bilaterally, respirations unlabored  Chest Wall:    No tenderness or deformity   Heart:    Regular rate and rhythm, S1 and S2 normal, no murmur, rub   or gallop     Abdomen:     Soft, non-tender, bowel sounds active all four quadrants,     no masses, no organomegaly        Extremities:   Extremities normal, atraumatic, no cyanosis or edema  Pulses:   2+ and symmetric all extremities  Skin:   Skin color, texture, turgor normal, no rashes or lesions  Lymph nodes:   Cervical, supraclavicular, and  axillary nodes normal  Neurologic:   CNII-XII intact, normal strength, sensation and reflexes    throughout   Assessment & Plan:    Routine Health Maintenance and Physical Exam  Immunization History  Administered Date(s) Administered   Influenza Split 05/24/2014, 05/25/2015, 04/18/2016, 05/15/2019   Influenza,inj,Quad PF,6+ Mos 04/25/2021   Influenza,inj,Quad PF,6-35 Mos 05/24/2017   Influenza-Unspecified 05/24/2014, 05/25/2015, 04/18/2016, 05/15/2019   Moderna Sars-Covid-2 Vaccination 03/13/2020, 04/12/2020   Pneumococcal Polysaccharide-23 10/05/2015   Td 07/25/2003, 07/25/2003   Tdap 07/25/2003, 07/25/2003, 10/31/2018    Health Maintenance  Topic Date Due   Hepatitis C Screening  Never done   INFLUENZA VACCINE  02/21/2022   HIV Screening  03/01/2023 (Originally 04/02/1990)   COLONOSCOPY (Pts 45-64yr Insurance coverage will need to be confirmed)  09/21/2028   TETANUS/TDAP  10/30/2028   HPV VACCINES  Aged Out   COVID-19 Vaccine  Discontinued    Discussed health benefits of physical activity, and encouraged her to engage in regular exercise appropriate for her age and condition. .. Discussed 150 minutes of exercise a week.  Encouraged vitamin D 1000 units and Calcium 13013mor 4 servings of dairy a day.  PHQ no concerns.  Fasting labs ordered Hysterectomy no pap needed Mammogram ordered             Colonoscopy UTD Refilled vyvanse BP not to goal increase to one full tablet of HcTZ start checking at home with goal under 140/90 Continue to work on smoking cessation Increased wellburin to 15048mL Stop saxenda and start wegovy 1.7mg60md then 2.4mg 26mkly Follow up in 6 months   Return in about 6 months (around  08/31/2022) for F/U HTN.     Chynah Orihuela Iran PlanasC

## 2022-03-01 ENCOUNTER — Other Ambulatory Visit (HOSPITAL_BASED_OUTPATIENT_CLINIC_OR_DEPARTMENT_OTHER): Payer: Self-pay

## 2022-03-01 ENCOUNTER — Other Ambulatory Visit: Payer: Self-pay | Admitting: Neurology

## 2022-03-01 ENCOUNTER — Encounter: Payer: Self-pay | Admitting: Physician Assistant

## 2022-03-01 DIAGNOSIS — N289 Disorder of kidney and ureter, unspecified: Secondary | ICD-10-CM

## 2022-03-01 DIAGNOSIS — R944 Abnormal results of kidney function studies: Secondary | ICD-10-CM | POA: Insufficient documentation

## 2022-03-01 LAB — LIPID PANEL W/REFLEX DIRECT LDL
Cholesterol: 186 mg/dL (ref <200)
HDL: 81 mg/dL (ref >=50)
LDL Cholesterol (Calc): 85 mg/dL (calc)
Non-HDL Cholesterol (Calc): 105 mg/dL (calc) (ref <130)
Total CHOL/HDL Ratio: 2.3 (calc) (ref <5.0)
Triglycerides: 102 mg/dL (ref <150)

## 2022-03-01 LAB — COMPLETE METABOLIC PANEL WITH GFR
AG Ratio: 1.3 (calc) (ref 1.0–2.5)
ALT: 14 U/L (ref 6–29)
AST: 17 U/L (ref 10–35)
Albumin: 4.3 g/dL (ref 3.6–5.1)
Alkaline phosphatase (APISO): 63 U/L (ref 31–125)
BUN/Creatinine Ratio: 10 (calc) (ref 6–22)
BUN: 12 mg/dL (ref 7–25)
CO2: 27 mmol/L (ref 20–32)
Calcium: 9.6 mg/dL (ref 8.6–10.2)
Chloride: 103 mmol/L (ref 98–110)
Creat: 1.25 mg/dL — ABNORMAL HIGH (ref 0.50–0.99)
Globulin: 3.2 g/dL (calc) (ref 1.9–3.7)
Glucose, Bld: 74 mg/dL (ref 65–99)
Potassium: 4.8 mmol/L (ref 3.5–5.3)
Sodium: 138 mmol/L (ref 135–146)
Total Bilirubin: 0.7 mg/dL (ref 0.2–1.2)
Total Protein: 7.5 g/dL (ref 6.1–8.1)
eGFR: 54 mL/min/{1.73_m2} — ABNORMAL LOW (ref >=60)

## 2022-03-01 LAB — CBC WITH DIFFERENTIAL/PLATELET
Absolute Monocytes: 679 cells/uL (ref 200–950)
Basophils Absolute: 52 cells/uL (ref 0–200)
Basophils Relative: 0.6 %
Eosinophils Absolute: 61 cells/uL (ref 15–500)
Eosinophils Relative: 0.7 %
HCT: 39.4 % (ref 35.0–45.0)
Hemoglobin: 13.2 g/dL (ref 11.7–15.5)
Lymphs Abs: 2828 cells/uL (ref 850–3900)
MCH: 29.9 pg (ref 27.0–33.0)
MCHC: 33.5 g/dL (ref 32.0–36.0)
MCV: 89.3 fL (ref 80.0–100.0)
MPV: 10.6 fL (ref 7.5–12.5)
Monocytes Relative: 7.8 %
Neutro Abs: 5081 cells/uL (ref 1500–7800)
Neutrophils Relative %: 58.4 %
Platelets: 233 10*3/uL (ref 140–400)
RBC: 4.41 10*6/uL (ref 3.80–5.10)
RDW: 14.6 % (ref 11.0–15.0)
Total Lymphocyte: 32.5 %
WBC: 8.7 10*3/uL (ref 3.8–10.8)

## 2022-03-01 LAB — B12 AND FOLATE PANEL
Folate: 12.1 ng/mL
Vitamin B-12: 842 pg/mL (ref 200–1100)

## 2022-03-01 LAB — HEPATITIS C ANTIBODY: Hepatitis C Ab: NONREACTIVE

## 2022-03-01 LAB — TSH: TSH: 3.11 mIU/L

## 2022-03-01 NOTE — Progress Notes (Signed)
Ariea,   Cholesterol looks great.  Hemoglobin looks much better.  B12 and folate are wonderful.  Thyroid looks good.  Liver and glucose look great.  Your kidney function has dropped some. Avoid any NSAIDs and make sure to drink plenty of water. Recheck bmp only in 2-3 weeks I want to see if this is stable or trending.

## 2022-03-02 ENCOUNTER — Other Ambulatory Visit (HOSPITAL_BASED_OUTPATIENT_CLINIC_OR_DEPARTMENT_OTHER): Payer: Self-pay

## 2022-03-03 ENCOUNTER — Other Ambulatory Visit (HOSPITAL_BASED_OUTPATIENT_CLINIC_OR_DEPARTMENT_OTHER): Payer: Self-pay

## 2022-03-06 ENCOUNTER — Other Ambulatory Visit (HOSPITAL_BASED_OUTPATIENT_CLINIC_OR_DEPARTMENT_OTHER): Payer: Self-pay

## 2022-03-07 ENCOUNTER — Encounter: Payer: Self-pay | Admitting: Physician Assistant

## 2022-03-07 ENCOUNTER — Other Ambulatory Visit (HOSPITAL_BASED_OUTPATIENT_CLINIC_OR_DEPARTMENT_OTHER): Payer: Self-pay

## 2022-03-08 ENCOUNTER — Other Ambulatory Visit (HOSPITAL_BASED_OUTPATIENT_CLINIC_OR_DEPARTMENT_OTHER): Payer: Self-pay

## 2022-03-09 ENCOUNTER — Other Ambulatory Visit (HOSPITAL_BASED_OUTPATIENT_CLINIC_OR_DEPARTMENT_OTHER): Payer: Self-pay

## 2022-03-10 ENCOUNTER — Telehealth: Payer: Self-pay

## 2022-03-10 ENCOUNTER — Other Ambulatory Visit (HOSPITAL_BASED_OUTPATIENT_CLINIC_OR_DEPARTMENT_OTHER): Payer: Self-pay

## 2022-03-10 NOTE — Telephone Encounter (Signed)
Initiated Prior authorization [id number] 1.'7MG'$ /0.75ML auto-injectors  Via: Covermymeds Case/Key:[id number] Status: approved  as of 03/10/22 Reason:Member has an active PA on file which is expiring on 03/09/2023 and has 12 no. of fills remaining. Notified Pt via: Mychart

## 2022-03-24 ENCOUNTER — Other Ambulatory Visit (HOSPITAL_BASED_OUTPATIENT_CLINIC_OR_DEPARTMENT_OTHER): Payer: Self-pay

## 2022-04-12 ENCOUNTER — Other Ambulatory Visit (HOSPITAL_BASED_OUTPATIENT_CLINIC_OR_DEPARTMENT_OTHER): Payer: Self-pay

## 2022-04-12 ENCOUNTER — Other Ambulatory Visit: Payer: Self-pay | Admitting: Physician Assistant

## 2022-04-12 MED ORDER — WEGOVY 1.7 MG/0.75ML ~~LOC~~ SOAJ
1.7000 mg | SUBCUTANEOUS | 0 refills | Status: DC
Start: 2022-04-12 — End: 2022-09-20
  Filled 2022-04-12: qty 3, 28d supply, fill #0

## 2022-05-12 ENCOUNTER — Other Ambulatory Visit (HOSPITAL_BASED_OUTPATIENT_CLINIC_OR_DEPARTMENT_OTHER): Payer: Self-pay

## 2022-05-12 ENCOUNTER — Other Ambulatory Visit: Payer: Self-pay | Admitting: Physician Assistant

## 2022-05-12 MED ORDER — FLUARIX QUADRIVALENT 0.5 ML IM SUSY
PREFILLED_SYRINGE | INTRAMUSCULAR | 0 refills | Status: DC
  Filled 2022-05-12: qty 0.5, 1d supply, fill #0

## 2022-05-23 ENCOUNTER — Other Ambulatory Visit (HOSPITAL_BASED_OUTPATIENT_CLINIC_OR_DEPARTMENT_OTHER): Payer: Self-pay

## 2022-05-23 ENCOUNTER — Other Ambulatory Visit: Payer: Self-pay | Admitting: Physician Assistant

## 2022-05-24 ENCOUNTER — Other Ambulatory Visit (HOSPITAL_BASED_OUTPATIENT_CLINIC_OR_DEPARTMENT_OTHER): Payer: Self-pay

## 2022-05-24 MED ORDER — WEGOVY 2.4 MG/0.75ML ~~LOC~~ SOAJ
2.4000 mg | SUBCUTANEOUS | 2 refills | Status: DC
Start: 2022-05-24 — End: 2022-08-30
  Filled 2022-05-24: qty 3, 28d supply, fill #0
  Filled 2022-06-19: qty 6, 56d supply, fill #1

## 2022-05-29 ENCOUNTER — Other Ambulatory Visit (HOSPITAL_BASED_OUTPATIENT_CLINIC_OR_DEPARTMENT_OTHER): Payer: Self-pay

## 2022-05-31 ENCOUNTER — Other Ambulatory Visit (HOSPITAL_BASED_OUTPATIENT_CLINIC_OR_DEPARTMENT_OTHER): Payer: Self-pay

## 2022-06-19 ENCOUNTER — Other Ambulatory Visit (HOSPITAL_BASED_OUTPATIENT_CLINIC_OR_DEPARTMENT_OTHER): Payer: Self-pay

## 2022-06-21 ENCOUNTER — Other Ambulatory Visit: Payer: Self-pay | Admitting: Physician Assistant

## 2022-06-21 ENCOUNTER — Other Ambulatory Visit (HOSPITAL_BASED_OUTPATIENT_CLINIC_OR_DEPARTMENT_OTHER): Payer: Self-pay

## 2022-06-21 MED ORDER — FLUTICASONE PROPIONATE 50 MCG/ACT NA SUSP
2.0000 | Freq: Every day | NASAL | 3 refills | Status: DC
  Filled 2022-06-21: qty 48, 90d supply, fill #0
  Filled 2023-03-14 (×2): qty 48, 90d supply, fill #1

## 2022-06-21 MED ORDER — LISDEXAMFETAMINE DIMESYLATE 70 MG PO CAPS
70.0000 mg | ORAL_CAPSULE | Freq: Every morning | ORAL | 0 refills | Status: DC
  Filled 2022-06-21: qty 10, 10d supply, fill #0
  Filled 2022-06-22: qty 80, 80d supply, fill #0

## 2022-06-21 NOTE — Telephone Encounter (Signed)
Last appt 02/28/2022 - told to follow up in 6 months  Last written 02/28/2022 #90 with no refills

## 2022-06-22 ENCOUNTER — Other Ambulatory Visit (HOSPITAL_BASED_OUTPATIENT_CLINIC_OR_DEPARTMENT_OTHER): Payer: Self-pay

## 2022-07-06 ENCOUNTER — Other Ambulatory Visit (HOSPITAL_BASED_OUTPATIENT_CLINIC_OR_DEPARTMENT_OTHER): Payer: Self-pay

## 2022-08-23 ENCOUNTER — Other Ambulatory Visit (HOSPITAL_BASED_OUTPATIENT_CLINIC_OR_DEPARTMENT_OTHER): Payer: Self-pay

## 2022-08-30 ENCOUNTER — Other Ambulatory Visit: Payer: Self-pay | Admitting: Physician Assistant

## 2022-08-30 ENCOUNTER — Other Ambulatory Visit (HOSPITAL_BASED_OUTPATIENT_CLINIC_OR_DEPARTMENT_OTHER): Payer: Self-pay

## 2022-08-30 MED ORDER — WEGOVY 2.4 MG/0.75ML ~~LOC~~ SOAJ
2.4000 mg | SUBCUTANEOUS | 0 refills | Status: DC
  Filled 2022-08-30: qty 3, 28d supply, fill #0

## 2022-09-14 ENCOUNTER — Other Ambulatory Visit: Payer: Self-pay | Admitting: Pharmacist

## 2022-09-14 NOTE — Progress Notes (Signed)
Patient appearing on report for [employer] - Hypertension Control report due to last documented ambulatory blood pressure of 146/88 on 02/28/22. Next appointment with PCP is not scheduled   Outreached patient to discuss hypertension control and medication management. Left voicemail for patient to return my call at their convenience.   Larinda Buttery, PharmD Clinical Pharmacist [facility] [phone]

## 2022-09-15 ENCOUNTER — Other Ambulatory Visit (HOSPITAL_BASED_OUTPATIENT_CLINIC_OR_DEPARTMENT_OTHER): Payer: Self-pay

## 2022-09-20 ENCOUNTER — Other Ambulatory Visit: Payer: Self-pay | Admitting: Physician Assistant

## 2022-09-20 ENCOUNTER — Other Ambulatory Visit (HOSPITAL_BASED_OUTPATIENT_CLINIC_OR_DEPARTMENT_OTHER): Payer: Self-pay

## 2022-09-20 MED ORDER — WEGOVY 2.4 MG/0.75ML ~~LOC~~ SOAJ
2.4000 mg | SUBCUTANEOUS | 0 refills | Status: DC
  Filled 2022-09-20 – 2022-09-21 (×2): qty 9, 84d supply, fill #0

## 2022-09-20 MED ORDER — WEGOVY 1.7 MG/0.75ML ~~LOC~~ SOAJ
1.7000 mg | SUBCUTANEOUS | 0 refills | Status: DC
  Filled 2022-09-20: qty 9, 84d supply, fill #0

## 2022-09-20 NOTE — Telephone Encounter (Signed)
Sent 1.'7mg'$  and 2.'4mg'$  weekly with 2 90 day supplies since insurance drops coverage. Sent to pharmacy.

## 2022-09-20 NOTE — Telephone Encounter (Signed)
Last seen in August, was to follow up at the beginning of February, but trying to get these before [facility] cut off. Please advise.

## 2022-09-21 ENCOUNTER — Other Ambulatory Visit (HOSPITAL_BASED_OUTPATIENT_CLINIC_OR_DEPARTMENT_OTHER): Payer: Self-pay

## 2022-10-06 ENCOUNTER — Other Ambulatory Visit (HOSPITAL_BASED_OUTPATIENT_CLINIC_OR_DEPARTMENT_OTHER): Payer: Self-pay

## 2022-10-11 ENCOUNTER — Other Ambulatory Visit (HOSPITAL_BASED_OUTPATIENT_CLINIC_OR_DEPARTMENT_OTHER): Payer: Self-pay

## 2022-12-06 ENCOUNTER — Other Ambulatory Visit (HOSPITAL_BASED_OUTPATIENT_CLINIC_OR_DEPARTMENT_OTHER): Payer: Self-pay

## 2023-01-25 ENCOUNTER — Ambulatory Visit
Admission: EM | Admit: 2023-01-25 | Discharge: 2023-01-25 | Disposition: A | Discharge status: Home / Self Care | Payer: Commercial Managed Care - PPO | Attending: Family Medicine | Admitting: Family Medicine

## 2023-01-25 DIAGNOSIS — T23202A Burn of second degree of left hand, unspecified site, initial encounter: Secondary | ICD-10-CM | POA: Diagnosis not present

## 2023-01-25 DIAGNOSIS — T23232A Burn of second degree of multiple left fingers (nail), not including thumb, initial encounter: Secondary | ICD-10-CM

## 2023-01-25 MED ORDER — OXYCODONE-ACETAMINOPHEN 5-325 MG PO TABS: 2.0000 | ORAL_TABLET | Freq: Three times a day (TID) | ORAL | 0 refills | Status: DC | PRN

## 2023-01-25 MED ORDER — IBUPROFEN 800 MG PO TABS: 800.0000 mg | ORAL_TABLET | Freq: Three times a day (TID) | ORAL | 0 refills | Status: AC
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
## 2023-01-25 NOTE — ED Notes (Signed)
Wound cleansed and prain ease applied as well as silvadene cream. Wrapped in nonadherent dressing and supplies given to take home with her.

## 2023-01-26 ENCOUNTER — Telehealth: Payer: Self-pay | Admitting: Plastic Surgery

## 2023-01-26 ENCOUNTER — Ambulatory Visit
Admission: EM | Admit: 2023-01-26 | Discharge: 2023-01-26 | Disposition: A | Discharge status: Home / Self Care | Payer: Commercial Managed Care - PPO | Attending: Urgent Care | Admitting: Urgent Care

## 2023-01-26 DIAGNOSIS — T23262D Burn of second degree of back of left hand, subsequent encounter: Secondary | ICD-10-CM
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
## 2023-01-26 NOTE — Telephone Encounter (Signed)
Scheduled Monday at 1:45
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
## 2023-01-26 NOTE — Discharge Instructions (Signed)
Please continue use of the Silvadene cream.. Please use the Xeroform to the open blister, leave on for 12 hours then remove. We will call you with the instructions from the wound center once I hear back from them. Continue to use ibuprofen for mild pain, Percocet for severe pain.

## 2023-01-26 NOTE — Telephone Encounter (Signed)
Patient called and stated that a PA she saw at urgent care said they talked to Dr Ulice Bold and said that Dr Ulice Bold agreed to see patient Monday 01/29/23. We havent heard anything from Dr Ulice Bold so we told her we wernt able to schedule until we hear from Dr Ulice Bold.

## 2023-01-29 ENCOUNTER — Ambulatory Visit: Payer: Commercial Managed Care - PPO | Admitting: Plastic Surgery

## 2023-01-29 ENCOUNTER — Encounter: Payer: Self-pay | Admitting: Plastic Surgery

## 2023-01-29 ENCOUNTER — Other Ambulatory Visit (HOSPITAL_BASED_OUTPATIENT_CLINIC_OR_DEPARTMENT_OTHER): Payer: Self-pay

## 2023-01-29 VITALS — BP 144/90 | HR 85

## 2023-01-29 DIAGNOSIS — Y93G9 Activity, other involving cooking and grilling: Secondary | ICD-10-CM | POA: Diagnosis not present

## 2023-01-29 DIAGNOSIS — T23062A Burn of unspecified degree of back of left hand, initial encounter: Secondary | ICD-10-CM | POA: Insufficient documentation

## 2023-01-29 DIAGNOSIS — T23262A Burn of second degree of back of left hand, initial encounter: Secondary | ICD-10-CM

## 2023-01-29 DIAGNOSIS — X19XXXA Contact with other heat and hot substances, initial encounter: Secondary | ICD-10-CM | POA: Diagnosis not present

## 2023-01-29 MED ORDER — SILVER SULFADIAZINE 1 % EX CREA
1.0000 | TOPICAL_CREAM | Freq: Two times a day (BID) | CUTANEOUS | 0 refills | Status: AC
  Filled 2023-01-29 (×2): qty 400, 30d supply, fill #0

## 2023-01-29 NOTE — Progress Notes (Signed)
Patient ID: Michelle Evans, female    DOB: 1976-06-11, 47 y.o.   MRN: [id number]   Chief Complaint  Patient presents with   Skin Problem    The patient is a 47 year old female presenting here for follow-up after being discharged from the emergency room on July 4.  The patient states that she burned her left hand while working on the grill directly prior to arrival at the emergency room around 5 PM.  She has been using the Silvadene that she got at the emergency room.  It has run out so she does need some more.  Nothing looks infected.  The blisters have opened up.  There is also 1 blister that came off and that shows the partial second-degree burn that about 1 x 2 cm in size on the dorsal aspect of the left hand.  The burn involves her fingers as well.    Review of Systems  Constitutional: Negative.   HENT: Negative.    Eyes: Negative.   Respiratory: Negative.  Negative for chest tightness and shortness of breath.   Cardiovascular: Negative.   Gastrointestinal: Negative.   Endocrine: Negative.   Genitourinary: Negative.   Musculoskeletal: Negative.     Past Medical History:  Diagnosis Date   Abnormal Pap smear    18 yrs ago  cone biopsy   Acute upper respiratory infections of other multiple sites    ADD (attention deficit disorder)    Anemia    1990's   Dysmenorrhea    Fibroid tumor    Hypertension    Mild reactive airways disease    Follows with Dr. Tandy Gaw PCP @ [facility]   Wears contact lenses    Wears glasses    Wheezing     Past Surgical History:  Procedure Laterality Date   CERVICAL CONE BIOPSY  1994   VAGINAL HYSTERECTOMY N/A 09/27/2021   Procedure: HYSTERECTOMY VAGINAL WITH SALPINGECTOMY;  Surgeon: Milas Hock, MD;  Location: [facility];  Service: Gynecology;  Laterality: N/A;      Current Outpatient Medications:    AMBULATORY NON FORMULARY MEDICATION, Medication Name: Nitroglycerine 0.125 % apply pea size to affected  area twice per day2 for 6-8 weeks, Disp: 1 Package, Rfl: 1   buPROPion (WELLBUTRIN SR) 150 MG 12 hr tablet, Take 1 tablet (150 mg total) by mouth 2 (two) times daily., Disp: 180 tablet, Rfl: 1   Cyanocobalamin (B-12 COMPLIANCE INJECTION) 1000 MCG/ML KIT, Inject 1 mL as directed every 30 (thirty) days., Disp: , Rfl:    fluticasone (FLONASE) 50 MCG/ACT nasal spray, Place 2 sprays into both nostrils daily., Disp: 48 g, Rfl: 3   hydrochlorothiazide (HYDRODIURIL) 25 MG tablet, Take 1 tablet (25 mg total) by mouth daily., Disp: 90 tablet, Rfl: 3   ibuprofen (ADVIL) 800 MG tablet, Take 1 tablet (800 mg total) by mouth 3 (three) times daily., Disp: 30 tablet, Rfl: 0   influenza vac split quadrivalent PF (FLUARIX QUADRIVALENT) 0.5 ML injection, Inject into the muscle., Disp: 0.5 mL, Rfl: 0   LINZESS 145 MCG CAPS capsule, TAKE 1 CAPSULE BY MOUTH ONCE DAILY, Disp: 90 capsule, Rfl: 3   lisdexamfetamine (VYVANSE) 70 MG capsule, Take 1 capsule (70 mg total) by mouth every morning., Disp: 90 capsule, Rfl: 0   montelukast (SINGULAIR) 10 MG tablet, TAKE 1 TABLET BY MOUTH EVERY NIGHT (Patient taking differently: Take by mouth as needed.), Disp: 90 tablet, Rfl: 3   Multiple Vitamin (MULTIVITAMIN WITH MINERALS) TABS tablet,  Take 1 tablet by mouth daily., Disp: , Rfl:    OVER THE COUNTER MEDICATION, Take 1 capsule by mouth daily. Now Probiotic-10, Disp: , Rfl:    oxyCODONE-acetaminophen (PERCOCET/ROXICET) 5-325 MG tablet, Take 2 tablets by mouth every 8 (eight) hours as needed for severe pain., Disp: 24 tablet, Rfl: 0   Probiotic Product (PROBIOTIC ADVANCED PO), Take by mouth., Disp: , Rfl:    Probiotic, Lactobacillus, CAPS, Take by mouth., Disp: , Rfl:    WEGOVY 1.7 MG/0.75ML SOAJ, Inject 1.7 mg into the skin once a week. Use this dose for 1 month (4 shots) and then increase to next higher dose., Disp: 9 mL, Rfl: 0   WEGOVY 2.4 MG/0.75ML SOAJ, Inject 2.4 mg into the skin once a week., Disp: 9 mL, Rfl: 0   albuterol  (PROAIR HFA) 108 (90 Base) MCG/ACT inhaler, INHALE 2 PUFFS INTO THE LUNGS EVERY 6 HOURS AS NEEDED FOR WHEEZING, Disp: 6.7 g, Rfl: 2   Objective:   Vitals:   01/29/23 1403  BP: (!) 144/90  Pulse: 85  SpO2: (!) 84%    Physical Exam Vitals and nursing note reviewed.  Cardiovascular:     Rate and Rhythm: Normal rate.  Pulmonary:     Effort: Pulmonary effort is normal.  Skin:    General: Skin is warm.     Capillary Refill: Capillary refill takes less than 2 seconds.     Coloration: Skin is not jaundiced.  Neurological:     Mental Status: She is alert and oriented to person, place, and time.  Psychiatric:        Mood and Affect: Mood normal.        Behavior: Behavior normal.        Thought Content: Thought content normal.        Judgment: Judgment normal.     Assessment & Plan:  Partial thickness burn of back of left hand, initial encounter  Continue with Silvadene and Kerlix wrap.  Elevate is much as possible.  I would like to have her come back in 1 week for follow-up.  Wash once or twice a day if able.  Pictures were obtained of the patient and placed in the chart with the patient's or guardian's permission.   Alena Bills Ly Wass, DO

## 2023-01-30 ENCOUNTER — Other Ambulatory Visit (HOSPITAL_BASED_OUTPATIENT_CLINIC_OR_DEPARTMENT_OTHER): Payer: Self-pay

## 2023-01-31 ENCOUNTER — Other Ambulatory Visit: Payer: Self-pay | Admitting: Oncology

## 2023-01-31 DIAGNOSIS — Z006 Encounter for examination for normal comparison and control in clinical research program: Secondary | ICD-10-CM

## 2023-02-05 ENCOUNTER — Encounter: Payer: Self-pay | Admitting: *Deleted

## 2023-02-05 DIAGNOSIS — Z006 Encounter for examination for normal comparison and control in clinical research program: Secondary | ICD-10-CM

## 2023-02-05 NOTE — Research (Signed)
Received email that [employer] would like to withdraw from the [employer] trial. I will withdraw from trial. Lab orders have been canceled.

## 2023-02-06 ENCOUNTER — Other Ambulatory Visit: Payer: Self-pay | Admitting: Oncology

## 2023-02-06 DIAGNOSIS — Z006 Encounter for examination for normal comparison and control in clinical research program: Secondary | ICD-10-CM

## 2023-02-13 ENCOUNTER — Ambulatory Visit: Payer: Commercial Managed Care - PPO | Admitting: Physician Assistant

## 2023-03-02 ENCOUNTER — Other Ambulatory Visit (HOSPITAL_BASED_OUTPATIENT_CLINIC_OR_DEPARTMENT_OTHER): Payer: Self-pay | Admitting: Physician Assistant

## 2023-03-02 DIAGNOSIS — Z1231 Encounter for screening mammogram for malignant neoplasm of breast: Secondary | ICD-10-CM

## 2023-03-05 ENCOUNTER — Ambulatory Visit (INDEPENDENT_AMBULATORY_CARE_PROVIDER_SITE_OTHER): Payer: Commercial Managed Care - PPO | Admitting: Physician Assistant

## 2023-03-05 ENCOUNTER — Encounter: Payer: Self-pay | Admitting: Physician Assistant

## 2023-03-05 ENCOUNTER — Other Ambulatory Visit (HOSPITAL_BASED_OUTPATIENT_CLINIC_OR_DEPARTMENT_OTHER): Payer: Self-pay

## 2023-03-05 VITALS — BP 126/82 | HR 94 | Ht 68.0 in | Wt 203.0 lb

## 2023-03-05 DIAGNOSIS — F988 Other specified behavioral and emotional disorders with onset usually occurring in childhood and adolescence: Secondary | ICD-10-CM

## 2023-03-05 DIAGNOSIS — I10 Essential (primary) hypertension: Secondary | ICD-10-CM

## 2023-03-05 DIAGNOSIS — Z8041 Family history of malignant neoplasm of ovary: Secondary | ICD-10-CM | POA: Diagnosis not present

## 2023-03-05 DIAGNOSIS — Z716 Tobacco abuse counseling: Secondary | ICD-10-CM

## 2023-03-05 DIAGNOSIS — Z0000 Encounter for general adult medical examination without abnormal findings: Secondary | ICD-10-CM | POA: Diagnosis not present

## 2023-03-05 DIAGNOSIS — Z13220 Encounter for screening for lipoid disorders: Secondary | ICD-10-CM | POA: Diagnosis not present

## 2023-03-05 DIAGNOSIS — Z1231 Encounter for screening mammogram for malignant neoplasm of breast: Secondary | ICD-10-CM

## 2023-03-05 DIAGNOSIS — Z131 Encounter for screening for diabetes mellitus: Secondary | ICD-10-CM

## 2023-03-05 MED ORDER — BUPROPION HCL ER (SR) 150 MG PO TB12
150.0000 mg | ORAL_TABLET | Freq: Two times a day (BID) | ORAL | 1 refills | Status: DC
Start: 2023-03-05 — End: 2024-03-11
  Filled 2023-03-05 – 2023-03-14 (×3): qty 180, 90d supply, fill #0
  Filled 2023-09-18: qty 180, 90d supply, fill #1

## 2023-03-05 MED ORDER — HYDROCHLOROTHIAZIDE 25 MG PO TABS
25.0000 mg | ORAL_TABLET | Freq: Every day | ORAL | 3 refills | Status: DC
Start: 2023-03-05 — End: 2024-03-11
  Filled 2023-03-05 – 2023-03-14 (×3): qty 90, 90d supply, fill #0
  Filled 2023-06-13: qty 90, 90d supply, fill #1
  Filled 2023-09-18: qty 90, 90d supply, fill #2
  Filled 2024-01-04: qty 90, 90d supply, fill #3

## 2023-03-05 MED ORDER — LISDEXAMFETAMINE DIMESYLATE 70 MG PO CAPS
70.0000 mg | ORAL_CAPSULE | Freq: Every morning | ORAL | 0 refills | Status: DC
Start: 2023-03-05 — End: 2023-06-20
  Filled 2023-03-05 (×2): qty 90, 90d supply, fill #0

## 2023-03-05 NOTE — Progress Notes (Signed)
Complete physical exam  Patient: Michelle Evans   DOB: 02-17-1976   47 y.o. Female  MRN: [id number]  Subjective:    Chief Complaint  Patient presents with   Annual Exam    Michelle Evans is a 47 y.o. female who presents today for a complete physical exam. She reports consuming a general diet.  She is active but not any formal exercising.   She generally feels well. She reports sleeping well. She does not have additional problems to discuss today.    Most recent fall risk assessment:    03/05/2023    9:01 AM  Fall Risk   Falls in the past year? 0  Number falls in past yr: 0  Injury with Fall? 0     Most recent depression screenings:    03/05/2023    9:20 AM 02/28/2022    2:17 PM  PHQ 2/9 Scores  PHQ - 2 Score 2 0  PHQ- 9 Score 6 3    Vision:Within last year and Dental: No current dental problems  Patient Active Problem List   Diagnosis Date Noted   Burn of back of left hand 01/29/2023   Decreased GFR 03/01/2022   H/O: hysterectomy 09/27/2021   Primary hypertension 08/10/2021   Attention deficit disorder (ADD) without hyperactivity 08/17/2017   Premenstrual dysphoric disorder 08/17/2015   Candidiasis of female genitalia 09/18/2013   Nasal congestion 09/18/2013   Sinus infection 09/18/2013   Cough 09/18/2013   Other malaise and fatigue 02/24/2013   Overweight 02/24/2013   Tobacco use disorder 02/24/2013   History of cervical dysplasia 09/20/2011   Anemia 09/20/2011   Menorrhagia 09/20/2011   Past Medical History:  Diagnosis Date   Abnormal Pap smear    18 yrs ago  cone biopsy   Acute upper respiratory infections of other multiple sites    ADD (attention deficit disorder)    Anemia    1990's   Dysmenorrhea    Fibroid tumor    Hypertension    Mild reactive airways disease    Follows with Dr. Tandy Gaw PCP [facility]   Wears contact lenses    Wears glasses    Wheezing    Past Surgical History:  Procedure Laterality Date   CERVICAL  CONE BIOPSY  1994   VAGINAL HYSTERECTOMY N/A 09/27/2021   Procedure: HYSTERECTOMY VAGINAL WITH SALPINGECTOMY;  Surgeon: Milas Hock, MD;  Location: [facility];  Service: Gynecology;  Laterality: N/A;   Family History  Problem Relation Age of Onset   Breast cancer Mother 8   Hypertension Mother    Ovarian cancer Mother 66   Diabetes Maternal Grandmother    Hypertension Maternal Grandmother    Heart disease Maternal Grandmother    Cancer Maternal Grandfather        colon and prostate   CVA Maternal Grandfather    Colon polyps Neg Hx    Colon cancer Neg Hx    Esophageal cancer Neg Hx    Stomach cancer Neg Hx    Rectal cancer Neg Hx    Allergies  Allergen Reactions   Theophylline Hives, Itching and Swelling      Patient Care Team: Nolene Ebbs as PCP - General (Family Medicine)   Outpatient Medications Prior to Visit  Medication Sig   AMBULATORY NON FORMULARY MEDICATION Medication Name: Nitroglycerine 0.125 % apply pea size to affected area twice per day2 for 6-8 weeks   Cyanocobalamin (B-12 COMPLIANCE INJECTION) 1000 MCG/ML KIT Inject  1 mL as directed every 30 (thirty) days.   fluticasone (FLONASE) 50 MCG/ACT nasal spray Place 2 sprays into both nostrils daily.   ibuprofen (ADVIL) 800 MG tablet Take 1 tablet (800 mg total) by mouth 3 (three) times daily.   influenza vac split quadrivalent PF (FLUARIX QUADRIVALENT) 0.5 ML injection Inject into the muscle.   Multiple Vitamin (MULTIVITAMIN WITH MINERALS) TABS tablet Take 1 tablet by mouth daily.   OVER THE COUNTER MEDICATION Take 1 capsule by mouth daily. Now Probiotic-10   Probiotic Product (PROBIOTIC ADVANCED PO) Take by mouth.   Probiotic, Lactobacillus, CAPS Take by mouth.   silver sulfADIAZINE (SILVADENE) 1 % cream Apply 1 application topically to affected area 2 (two) times daily.   [DISCONTINUED] buPROPion (WELLBUTRIN SR) 150 MG 12 hr tablet Take 1 tablet (150 mg total) by mouth 2 (two) times  daily.   [DISCONTINUED] hydrochlorothiazide (HYDRODIURIL) 25 MG tablet Take 1 tablet (25 mg total) by mouth daily.   [DISCONTINUED] lisdexamfetamine (VYVANSE) 70 MG capsule Take 1 capsule (70 mg total) by mouth every morning.   [DISCONTINUED] oxyCODONE-acetaminophen (PERCOCET/ROXICET) 5-325 MG tablet Take 2 tablets by mouth every 8 (eight) hours as needed for severe pain.   [DISCONTINUED] WEGOVY 1.7 MG/0.75ML SOAJ Inject 1.7 mg into the skin once a week. Use this dose for 1 month (4 shots) and then increase to next higher dose.   [DISCONTINUED] WEGOVY 2.4 MG/0.75ML SOAJ Inject 2.4 mg into the skin once a week.   albuterol (PROAIR HFA) 108 (90 Base) MCG/ACT inhaler INHALE 2 PUFFS INTO THE LUNGS EVERY 6 HOURS AS NEEDED FOR WHEEZING   LINZESS 145 MCG CAPS capsule TAKE 1 CAPSULE BY MOUTH ONCE DAILY   montelukast (SINGULAIR) 10 MG tablet TAKE 1 TABLET BY MOUTH EVERY NIGHT (Patient taking differently: Take by mouth as needed.)   No facility-administered medications prior to visit.    Review of Systems  All other systems reviewed and are negative.         Objective:     BP 126/82   Pulse 94   Ht 5\' 8"  (1.727 m)   Wt 203 lb (92.1 kg)   SpO2 99%   BMI 30.87 kg/m  BP Readings from Last 3 Encounters:  03/05/23 126/82  01/29/23 (!) 144/90  01/26/23 119/85      Physical Exam Vitals reviewed.     BP 126/82   Pulse 94   Ht 5\' 8"  (1.727 m)   Wt 203 lb (92.1 kg)   SpO2 99%   BMI 30.87 kg/m   General Appearance:    Alert, cooperative, no distress, appears stated age  Head:    Normocephalic, without obvious abnormality, atraumatic  Eyes:    PERRL, conjunctiva/corneas clear, EOM's intact, fundi    benign, both eyes  Ears:    Normal TM's and external ear canals, both ears  Nose:   Nares normal, septum midline, mucosa normal, no drainage    or sinus tenderness  Throat:   Lips, mucosa, and tongue normal; teeth and gums normal  Neck:   Supple, symmetrical, trachea midline, no  adenopathy;    thyroid:  no enlargement/tenderness/nodules; no carotid   bruit or JVD  Back:     Symmetric, no curvature, ROM normal, no CVA tenderness  Lungs:     Clear to auscultation bilaterally, respirations unlabored  Chest Wall:    No tenderness or deformity   Heart:    Regular rate and rhythm, S1 and S2 normal, no murmur, rub   or  gallop     Abdomen:     Soft, non-tender, bowel sounds active all four quadrants,    no masses, no organomegaly        Extremities:   Extremities normal, atraumatic, no cyanosis or edema  Pulses:   2+ and symmetric all extremities  Skin:   Skin color, texture, turgor normal, no rashes or lesions  Lymph nodes:   Cervical, supraclavicular, and axillary nodes normal  Neurologic:   CNII-XII intact, normal strength, sensation and reflexes    throughout      Assessment & Plan:    Routine Health Maintenance and Physical Exam  Immunization History  Administered Date(s) Administered   Influenza Split 05/24/2014, 05/25/2015, 04/18/2016, 05/15/2019   Influenza,inj,Quad PF,6+ Mos 04/25/2021, 05/12/2022   Influenza,inj,Quad PF,6-35 Mos 05/24/2017   Influenza-Unspecified 05/24/2014, 05/25/2015, 04/18/2016, 05/15/2019   Moderna Sars-Covid-2 Vaccination 03/13/2020, 04/12/2020   Pneumococcal Polysaccharide-23 10/05/2015   Td 07/25/2003, 07/25/2003   Tdap 07/25/2003, 07/25/2003, 10/31/2018    Health Maintenance  Topic Date Due   MAMMOGRAM  02/08/2022   HIV Screening  07/24/2023 (Originally 04/02/1990)   INFLUENZA VACCINE  10/22/2023 (Originally 02/22/2023)   Colonoscopy  09/21/2028   DTaP/Tdap/Td (6 - Td or Tdap) 10/30/2028   Hepatitis C Screening  Completed   HPV VACCINES  Aged Out   COVID-19 Vaccine  Discontinued    Discussed health benefits of physical activity, and encouraged her to engage in regular exercise appropriate for her age and condition.  Marland KitchenPhoebe Sharps was seen today for annual exam.  Diagnoses and all orders for this visit:  Routine physical  examination -     TSH -     CBC with Differential/Platelet -     CMP14+EGFR -     Lipid panel -     CA 125  Encounter for screening mammogram for malignant neoplasm of breast  Family history of ovarian cancer -     CA 125  Screening for lipid disorders -     Lipid panel  Screening for diabetes mellitus -     CMP14+EGFR  Encounter for smoking cessation counseling -     buPROPion (WELLBUTRIN SR) 150 MG 12 hr tablet; Take 1 tablet (150 mg total) by mouth 2 (two) times daily.  Attention deficit disorder (ADD) without hyperactivity -     lisdexamfetamine (VYVANSE) 70 MG capsule; Take 1 capsule (70 mg total) by mouth every morning.  Primary hypertension -     hydrochlorothiazide (HYDRODIURIL) 25 MG tablet; Take 1 tablet (25 mg total) by mouth daily.   .. Discussed 150 minutes of exercise a week.  Encouraged vitamin D 1000 units and Calcium 1300mg  or 4 servings of dairy a day.  Fasting labs ordered today Mammogram scheduled No need for pap Family hx with mother having ovarian cancer, CA 125 ordered today for screening Colon cancer screening UTD Discussed smoking cessation and agreed to try wellbutrin again Discussed slowly cutting back while on wellbutrin PHQ/GAD not to goal hoping wellbutrin will help with mood as well Vyvanse refilled for ADHD BP to goal, continue HCTZ  Return in about 6 months (around 09/05/2023) for smoking cessation.     Tandy Gaw, PA-C

## 2023-03-05 NOTE — Addendum Note (Signed)
Addended by: Jomarie Longs on: 03/05/2023 01:13 PM   Modules accepted: Orders

## 2023-03-05 NOTE — Patient Instructions (Signed)

## 2023-03-06 ENCOUNTER — Other Ambulatory Visit (HOSPITAL_BASED_OUTPATIENT_CLINIC_OR_DEPARTMENT_OTHER): Payer: Self-pay

## 2023-03-06 NOTE — Progress Notes (Signed)
Michelle Evans,   Thyroid looks great.  CA125 normal range.  Normal hemoglobin and WBC.  Cholesterol looks good!  Pending CMP  Thus far labs look great.

## 2023-03-07 ENCOUNTER — Other Ambulatory Visit (HOSPITAL_BASED_OUTPATIENT_CLINIC_OR_DEPARTMENT_OTHER): Payer: Self-pay

## 2023-03-08 ENCOUNTER — Ambulatory Visit (HOSPITAL_BASED_OUTPATIENT_CLINIC_OR_DEPARTMENT_OTHER)
Admission: RE | Admit: 2023-03-08 | Discharge: 2023-03-08 | Disposition: A | Discharge status: Home / Self Care | Payer: Commercial Managed Care - PPO | Source: Ambulatory Visit | Attending: Physician Assistant | Admitting: Physician Assistant

## 2023-03-08 DIAGNOSIS — Z1231 Encounter for screening mammogram for malignant neoplasm of breast: Secondary | ICD-10-CM | POA: Diagnosis not present

## 2023-03-09 ENCOUNTER — Other Ambulatory Visit (HOSPITAL_BASED_OUTPATIENT_CLINIC_OR_DEPARTMENT_OTHER): Payer: Self-pay

## 2023-03-09 ENCOUNTER — Encounter: Payer: Self-pay | Admitting: Physician Assistant

## 2023-03-09 DIAGNOSIS — I10 Essential (primary) hypertension: Secondary | ICD-10-CM

## 2023-03-09 NOTE — Progress Notes (Signed)
Normal mammogram. Follow up in 1 year.

## 2023-03-09 NOTE — Addendum Note (Signed)
Addended by: Gayleen Orem on: 03/09/2023 04:21 PM   Modules accepted: Orders

## 2023-03-12 ENCOUNTER — Other Ambulatory Visit: Payer: Self-pay

## 2023-03-12 ENCOUNTER — Other Ambulatory Visit (HOSPITAL_BASED_OUTPATIENT_CLINIC_OR_DEPARTMENT_OTHER): Payer: Self-pay

## 2023-03-12 NOTE — Telephone Encounter (Signed)
Please re order 

## 2023-03-13 ENCOUNTER — Other Ambulatory Visit (HOSPITAL_BASED_OUTPATIENT_CLINIC_OR_DEPARTMENT_OTHER): Payer: Self-pay

## 2023-03-14 ENCOUNTER — Other Ambulatory Visit: Payer: Self-pay

## 2023-03-14 ENCOUNTER — Other Ambulatory Visit (HOSPITAL_BASED_OUTPATIENT_CLINIC_OR_DEPARTMENT_OTHER): Payer: Self-pay

## 2023-03-14 DIAGNOSIS — I10 Essential (primary) hypertension: Secondary | ICD-10-CM | POA: Diagnosis not present

## 2023-03-15 LAB — CMP14+EGFR
ALT: 11 IU/L (ref 0–32)
AST: 16 IU/L (ref 0–40)
Albumin: 4.2 g/dL (ref 3.9–4.9)
Alkaline Phosphatase: 55 IU/L (ref 44–121)
BUN/Creatinine Ratio: 8 — ABNORMAL LOW (ref 9–23)
BUN: 9 mg/dL (ref 6–24)
Bilirubin Total: 0.7 mg/dL (ref 0.0–1.2)
CO2: 21 mmol/L (ref 20–29)
Calcium: 9.5 mg/dL (ref 8.7–10.2)
Chloride: 101 mmol/L (ref 96–106)
Creatinine, Ser: 1.2 mg/dL — ABNORMAL HIGH (ref 0.57–1.00)
Globulin, Total: 2.6 g/dL (ref 1.5–4.5)
Glucose: 75 mg/dL (ref 70–99)
Potassium: 4 mmol/L (ref 3.5–5.2)
Sodium: 136 mmol/L (ref 134–144)
Total Protein: 6.8 g/dL (ref 6.0–8.5)
eGFR: 56 mL/min/{1.73_m2} — ABNORMAL LOW (ref >59)

## 2023-03-15 NOTE — Progress Notes (Signed)
Michelle Evans,   GFR improved from 1 year ago but still chronic kidney disease 3a. I would consider start farxiga to help prevent any kidney decline. We can discuss if you would like?

## 2023-03-19 ENCOUNTER — Encounter: Payer: Self-pay | Admitting: Physician Assistant

## 2023-03-23 ENCOUNTER — Other Ambulatory Visit (HOSPITAL_BASED_OUTPATIENT_CLINIC_OR_DEPARTMENT_OTHER): Payer: Self-pay

## 2023-03-30 ENCOUNTER — Other Ambulatory Visit (HOSPITAL_BASED_OUTPATIENT_CLINIC_OR_DEPARTMENT_OTHER): Payer: Self-pay

## 2023-04-25 ENCOUNTER — Other Ambulatory Visit (HOSPITAL_BASED_OUTPATIENT_CLINIC_OR_DEPARTMENT_OTHER): Payer: Self-pay

## 2023-04-25 MED ORDER — INFLUENZA VIRUS VACC SPLIT PF (FLUZONE) 0.5 ML IM SUSY
0.5000 mL | PREFILLED_SYRINGE | Freq: Once | INTRAMUSCULAR | 0 refills | Status: AC
  Filled 2023-04-25: qty 0.5, 1d supply, fill #0

## 2023-06-13 ENCOUNTER — Other Ambulatory Visit (HOSPITAL_BASED_OUTPATIENT_CLINIC_OR_DEPARTMENT_OTHER): Payer: Self-pay

## 2023-06-20 ENCOUNTER — Other Ambulatory Visit (HOSPITAL_BASED_OUTPATIENT_CLINIC_OR_DEPARTMENT_OTHER): Payer: Self-pay

## 2023-06-20 ENCOUNTER — Other Ambulatory Visit: Payer: Self-pay | Admitting: Physician Assistant

## 2023-06-20 DIAGNOSIS — F988 Other specified behavioral and emotional disorders with onset usually occurring in childhood and adolescence: Secondary | ICD-10-CM

## 2023-06-21 IMAGING — MG MM DIGITAL DIAGNOSTIC UNILAT*L* W/ TOMO W/ CAD
4 series · 4 of 12 positions shown · non-contrast
Comparison: Previous exam(s).

CLINICAL DATA: 44-year-old female presenting as a recall from
screening for possible left breast asymmetry. Family history of
breast cancer in the patient's mother at age 68.

EXAM:
DIGITAL DIAGNOSTIC UNILATERAL LEFT MAMMOGRAM WITH TOMOSYNTHESIS AND
CAD; ULTRASOUND LEFT BREAST LIMITED
TECHNIQUE: Left digital diagnostic mammography and breast tomosynthesis was
performed. The images were evaluated with computer-aided detection.;
Targeted ultrasound examination of the left breast was performed.

[L ML synth-2D]
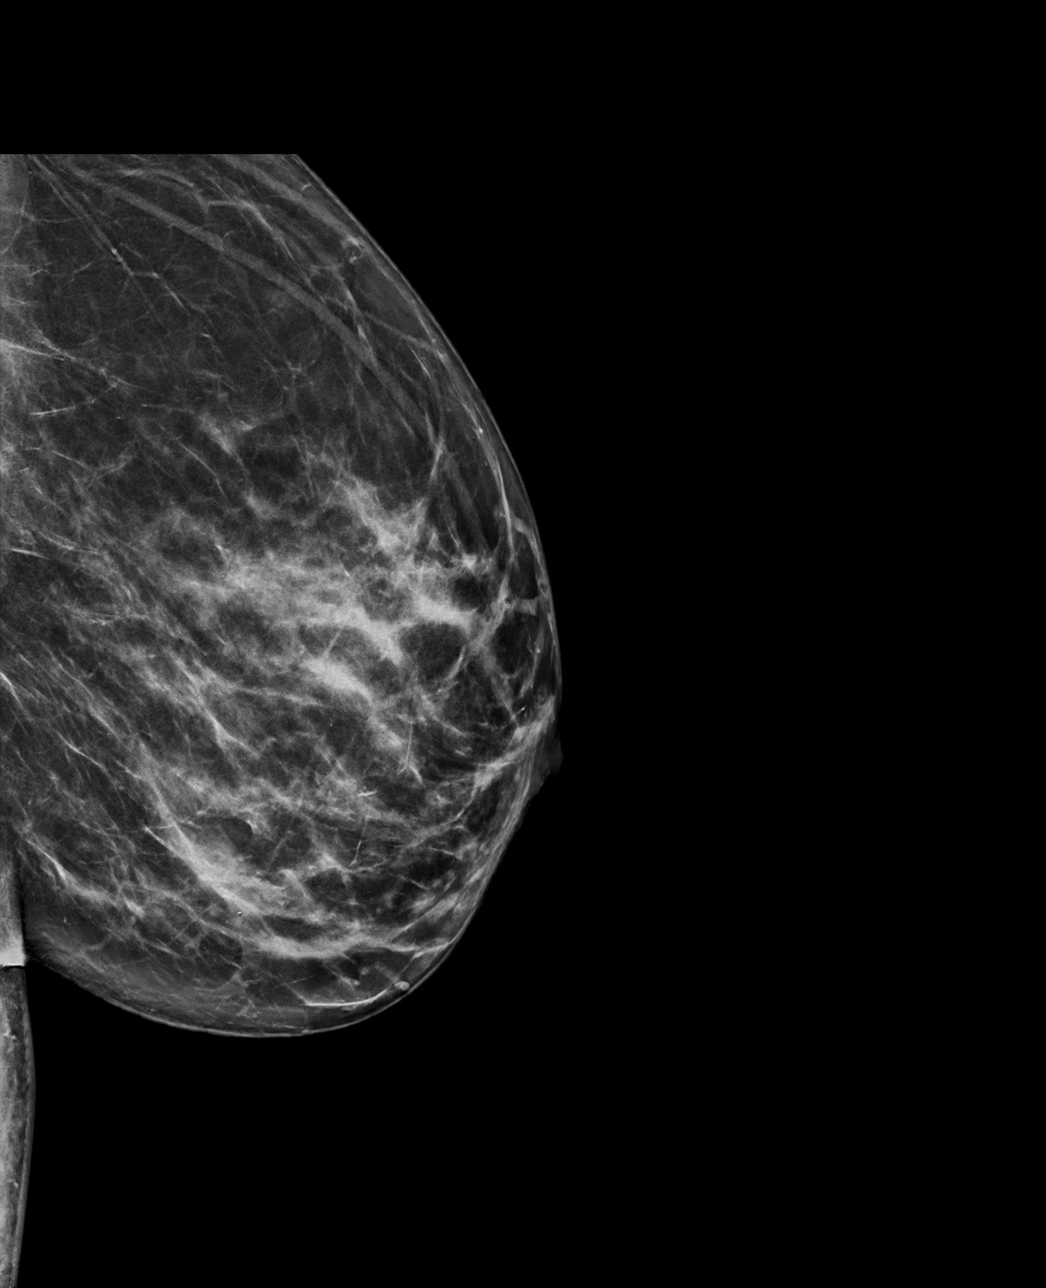

[L CC synth-2D]
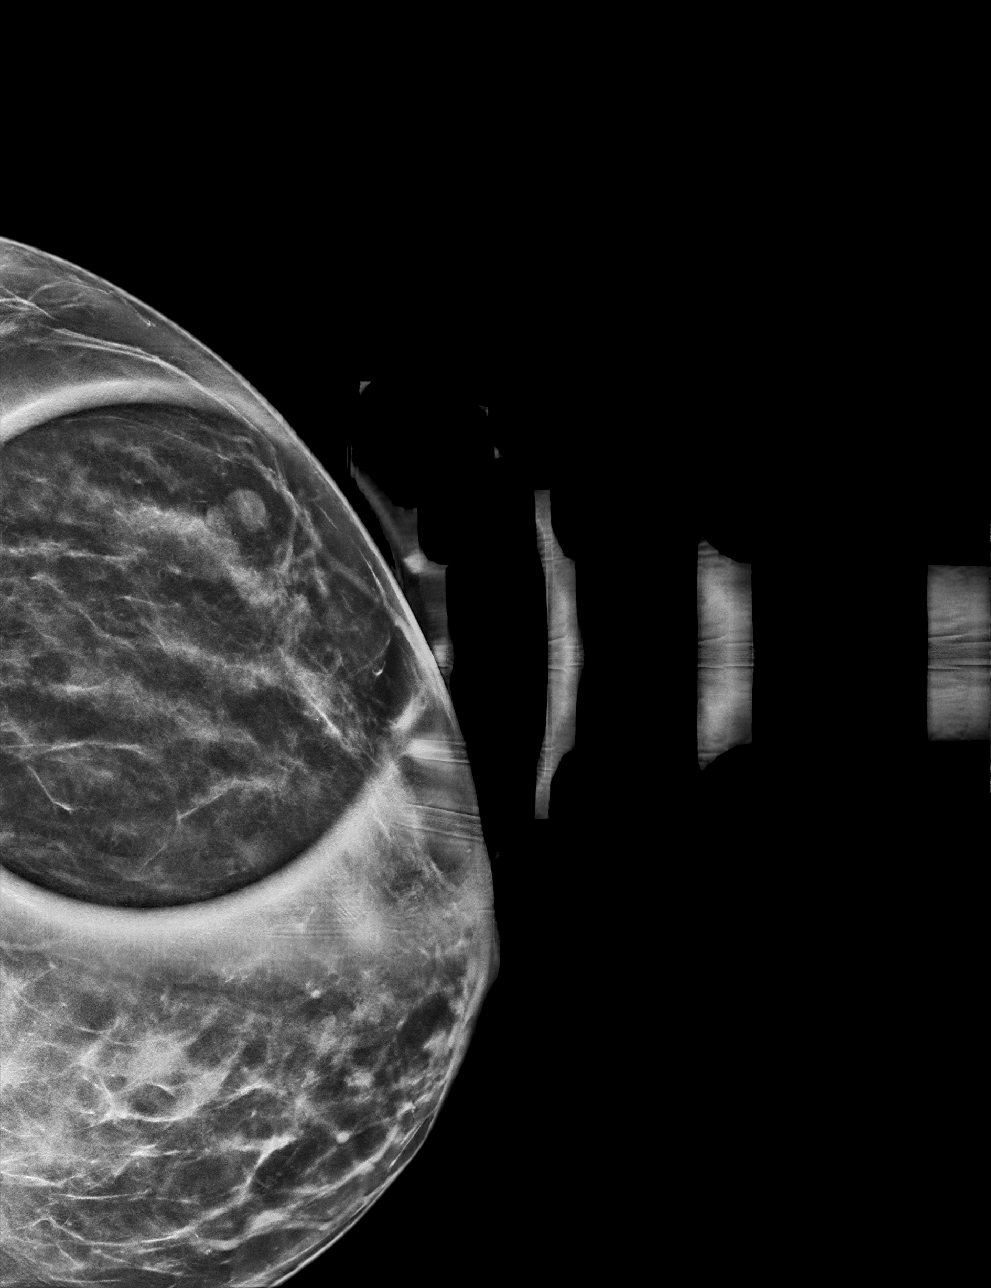

[L CC tomo · tomo slice 31/60.0]
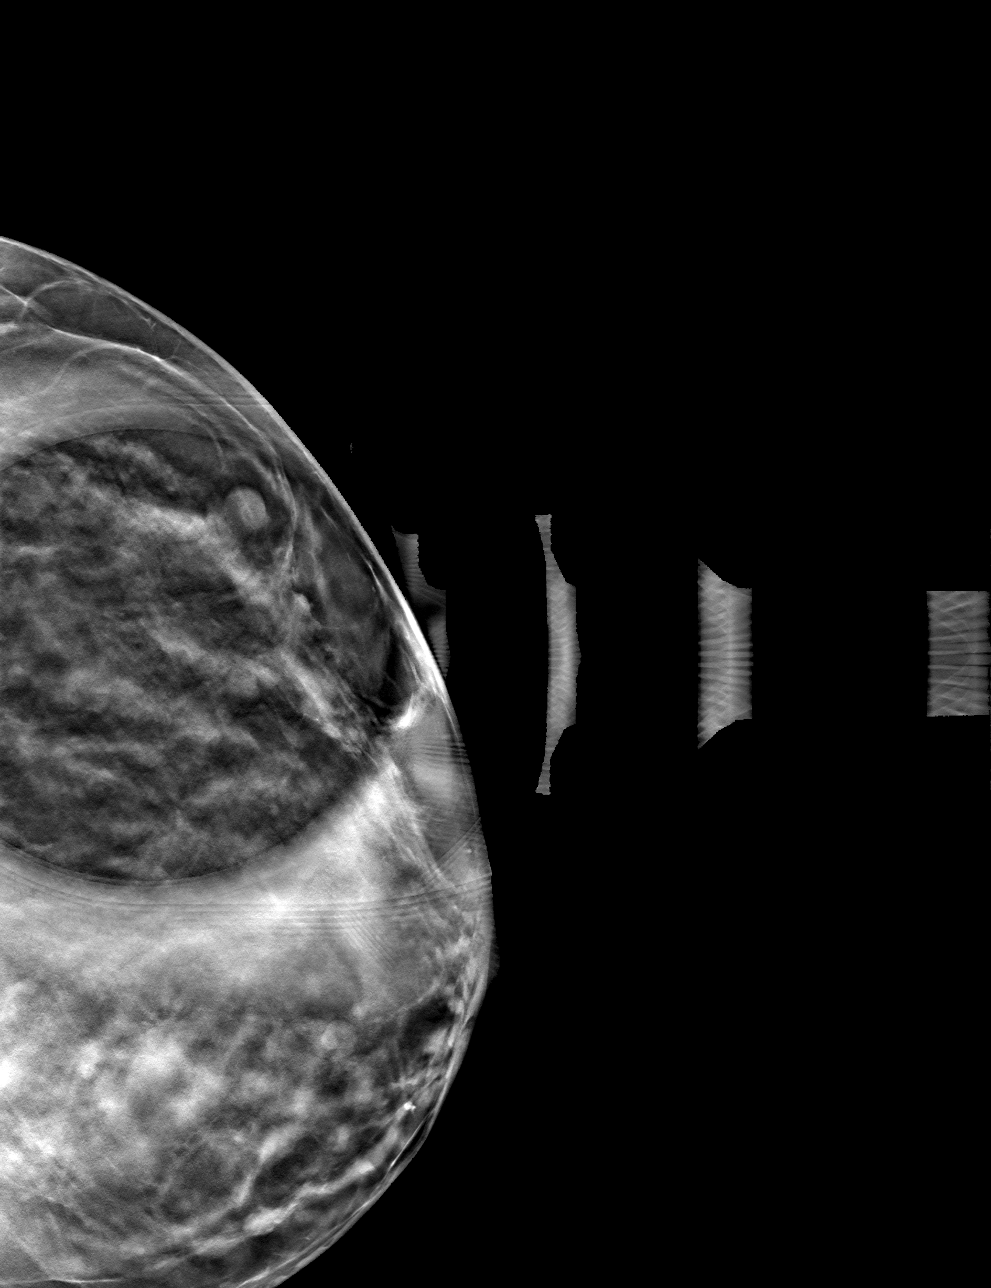

[L ML tomo · tomo slice 39/76.0]
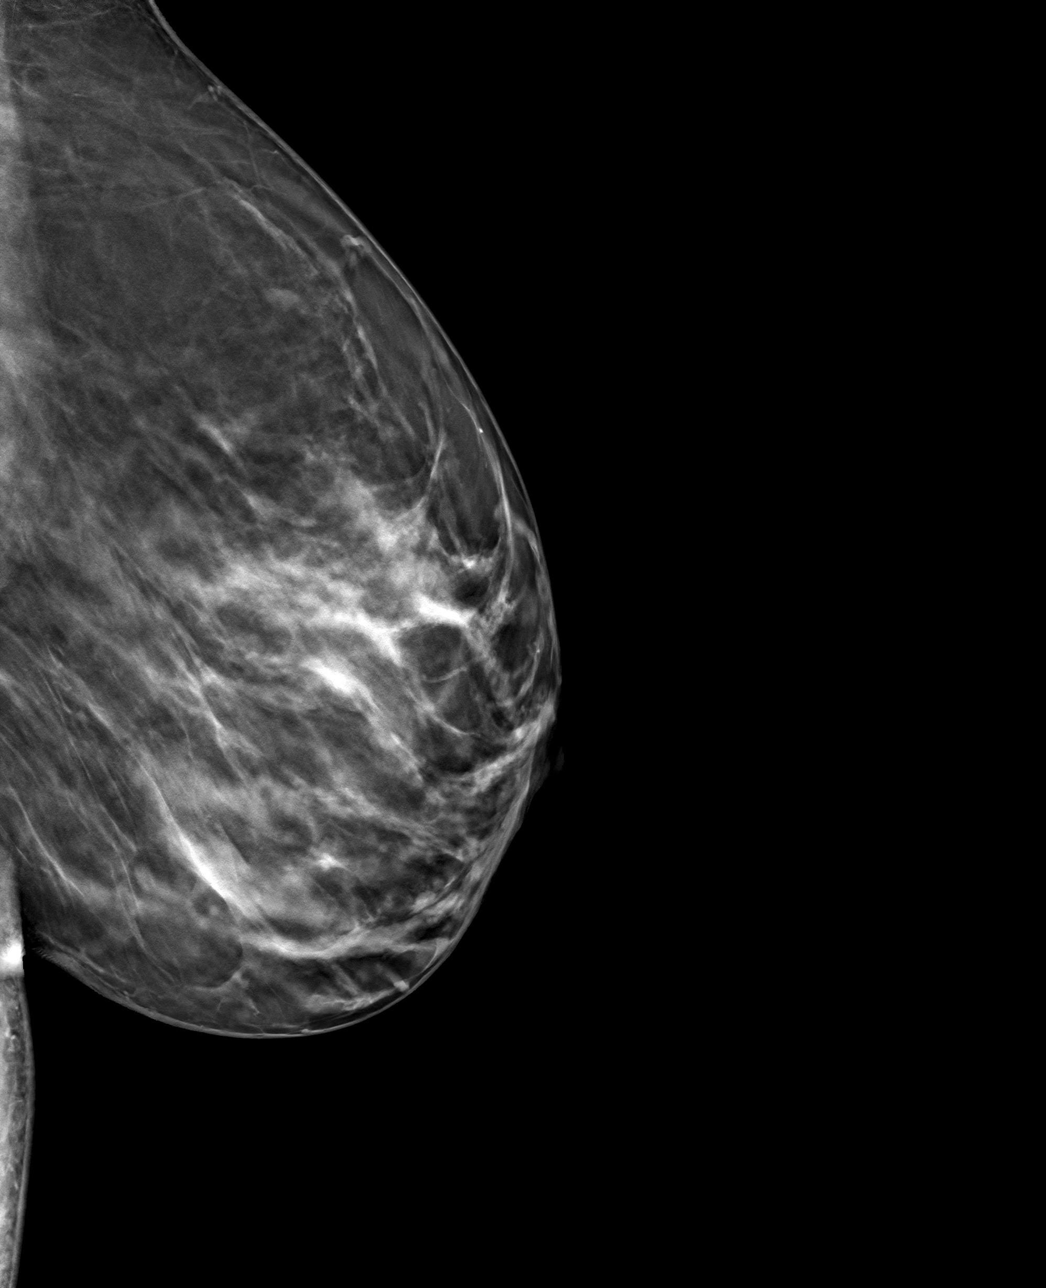

[4 of 12 positions shown; findings below may reference images not displayed]

ACR Breast Density Category c: The breast tissue is heterogeneously
dense, which may obscure small masses.
FINDINGS: Mammogram:

Spot compression tomosynthesis cc and full field mL tomosynthesis
views of the left breast were performed. There is persistence of an
oval circumscribed mass in the outer left breast spanning
approximately 0.8 cm.

Ultrasound:

Targeted ultrasound performed in the left breast at 3 o'clock 7 cm
from the nipple demonstrating adjacent oval circumscribed anechoic
masses consistent with simple cysts. A representative cyst measures
0.9 x 0.7 x 0.7 cm and likely corresponds to the mammographic
finding. There is an adjacent benign cluster of cysts measuring
cm.
IMPRESSION: Benign simple cysts and a cluster of cysts in the left breast at 3
o'clock.

RECOMMENDATION:
Screening mammogram in one year.(Code:ET-V-17D)

I have discussed the findings and recommendations with the patient.
If applicable, a reminder letter will be sent to the patient
regarding the next appointment.

BI-RADS CATEGORY  2: Benign.

## 2023-06-26 ENCOUNTER — Other Ambulatory Visit (HOSPITAL_BASED_OUTPATIENT_CLINIC_OR_DEPARTMENT_OTHER): Payer: Self-pay

## 2023-06-26 MED ORDER — LISDEXAMFETAMINE DIMESYLATE 70 MG PO CAPS
70.0000 mg | ORAL_CAPSULE | Freq: Every morning | ORAL | 0 refills | Status: DC
Start: 2023-06-26 — End: 2024-02-20
  Filled 2023-06-26: qty 90, 90d supply, fill #0

## 2023-06-27 ENCOUNTER — Other Ambulatory Visit (HOSPITAL_BASED_OUTPATIENT_CLINIC_OR_DEPARTMENT_OTHER): Payer: Self-pay

## 2023-09-18 ENCOUNTER — Other Ambulatory Visit: Payer: Self-pay

## 2023-09-18 ENCOUNTER — Other Ambulatory Visit: Payer: Self-pay | Admitting: Family Medicine

## 2023-09-21 ENCOUNTER — Other Ambulatory Visit (HOSPITAL_BASED_OUTPATIENT_CLINIC_OR_DEPARTMENT_OTHER): Payer: Self-pay

## 2023-09-21 MED ORDER — FLUTICASONE PROPIONATE 50 MCG/ACT NA SUSP
2.0000 | Freq: Every day | NASAL | 3 refills | Status: AC
  Filled 2023-09-21: qty 48, 90d supply, fill #0

## 2023-12-08 IMAGING — US US PELVIS COMPLETE WITH TRANSVAGINAL
1 series · 13 of 25 positions shown · non-contrast
Comparison: 09/25/2011

CLINICAL DATA: Heavy vaginal bleeding, menorrhagia, history of
fibroids, LMP 07/30/2021

EXAM:
TRANSABDOMINAL AND TRANSVAGINAL ULTRASOUND OF PELVIS
TECHNIQUE: Both transabdominal and transvaginal ultrasound examinations of the
pelvis were performed. Transabdominal technique was performed for
global imaging of the pelvis including uterus, ovaries, adnexal
regions, and pelvic cul-de-sac. It was necessary to proceed with
endovaginal exam following the transabdominal exam to visualize the
ovaries and adnexa.

[Series 1: us pelvis complete with transvaginal · 13 of 88 slices shown]
[im 1/88]
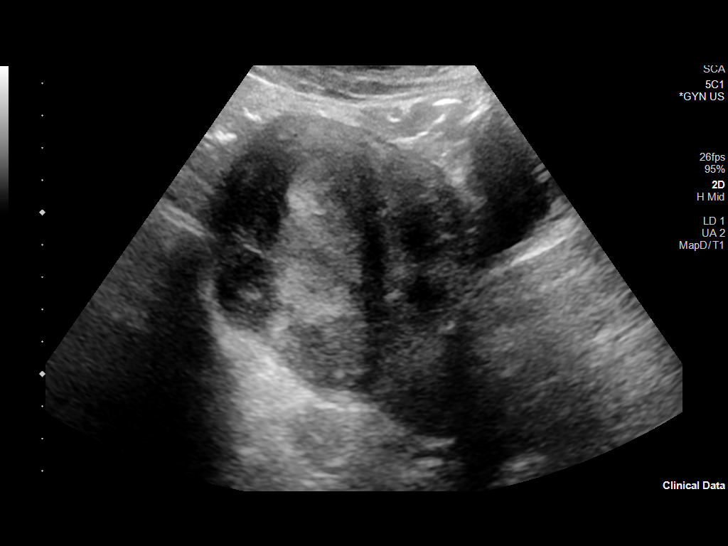
[im 8/88]
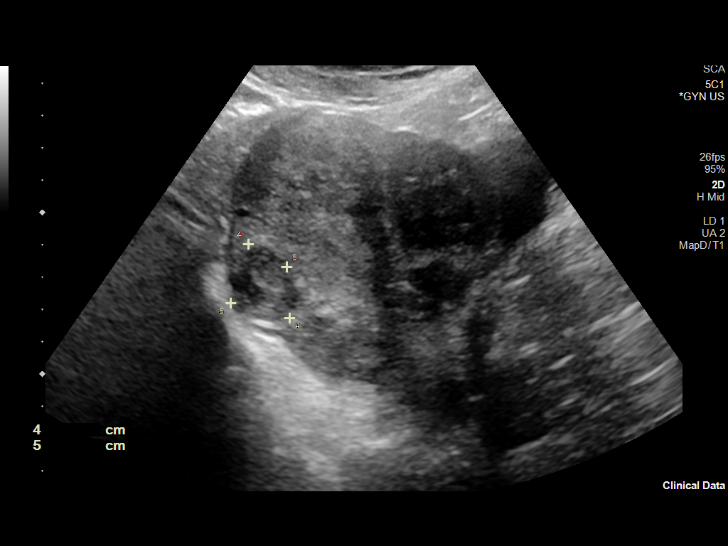
[im 15/88]
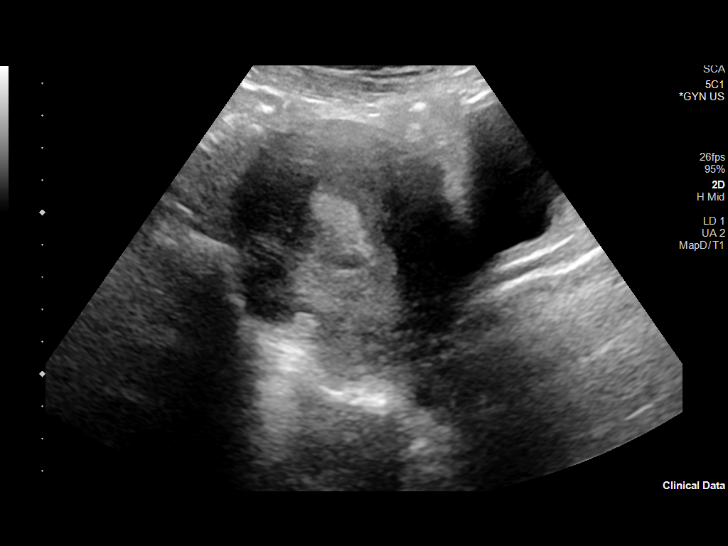
[im 22/88]
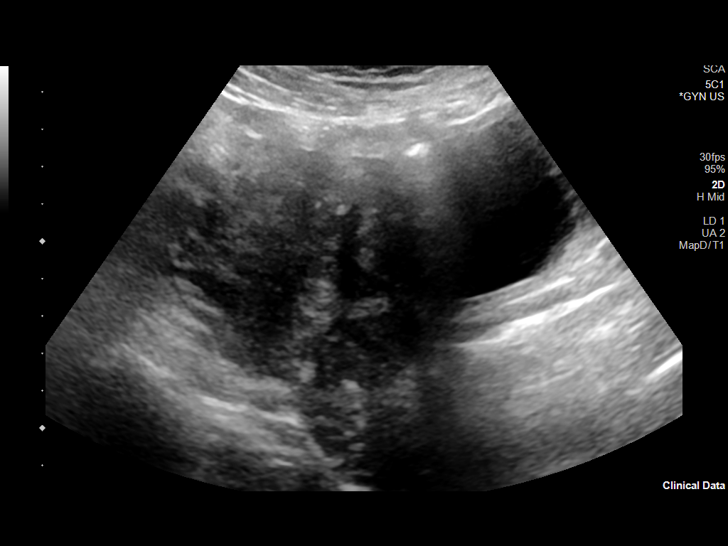
[im 30/88]
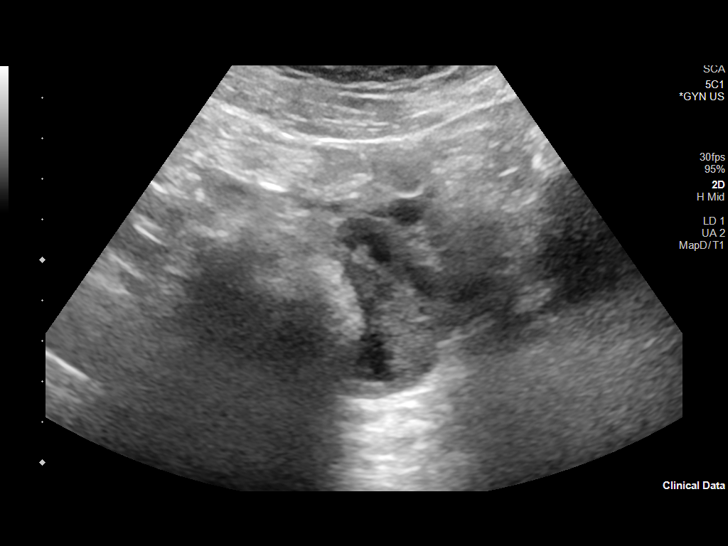
[im 37/88]
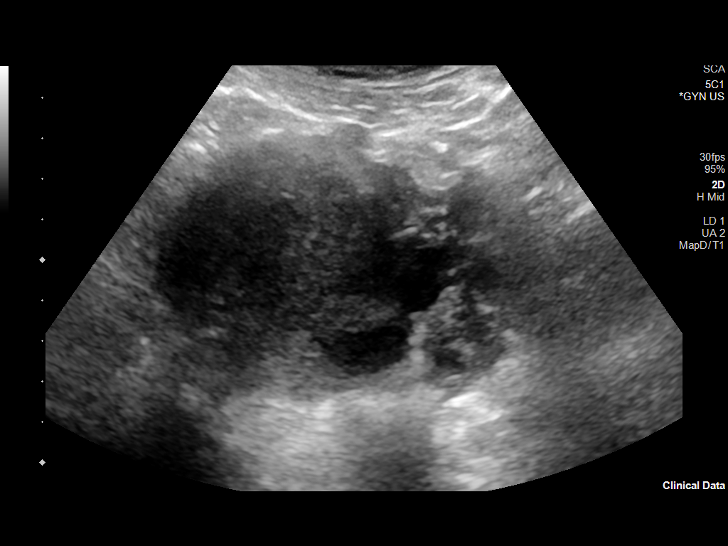
[im 44/88]
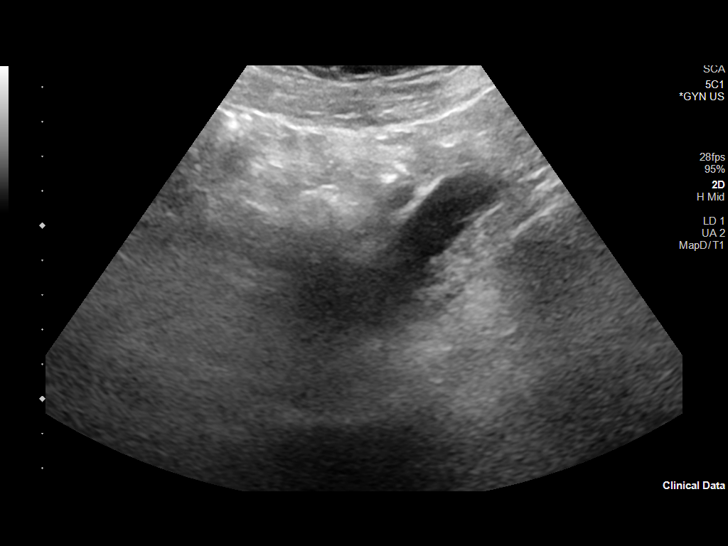
[im 51/88]
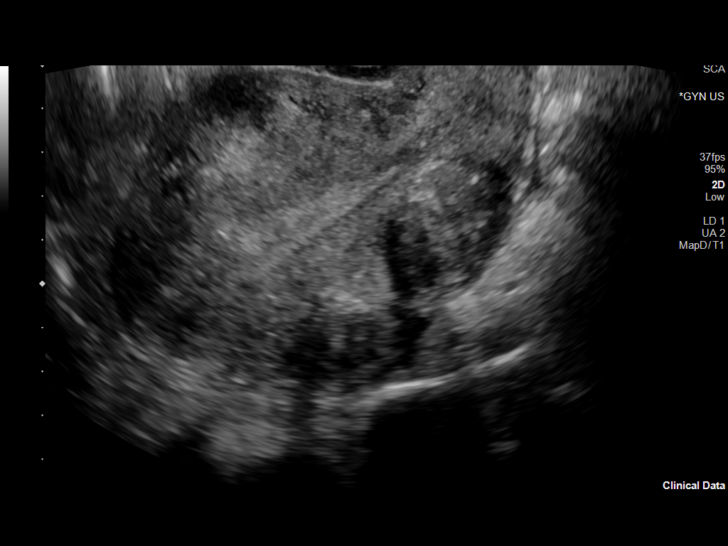
[im 59/88]
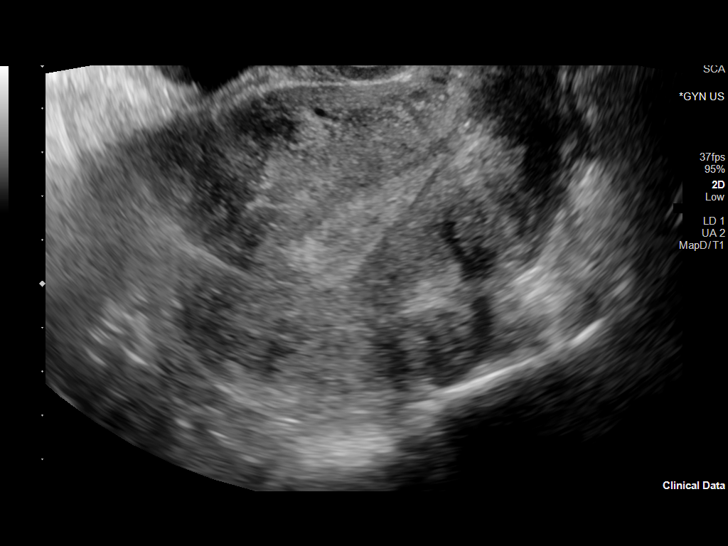
[im 66/88]
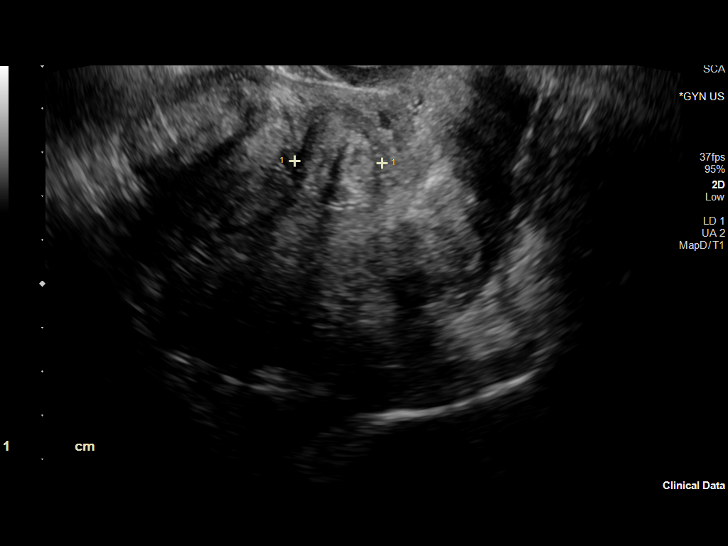
[im 73/88]
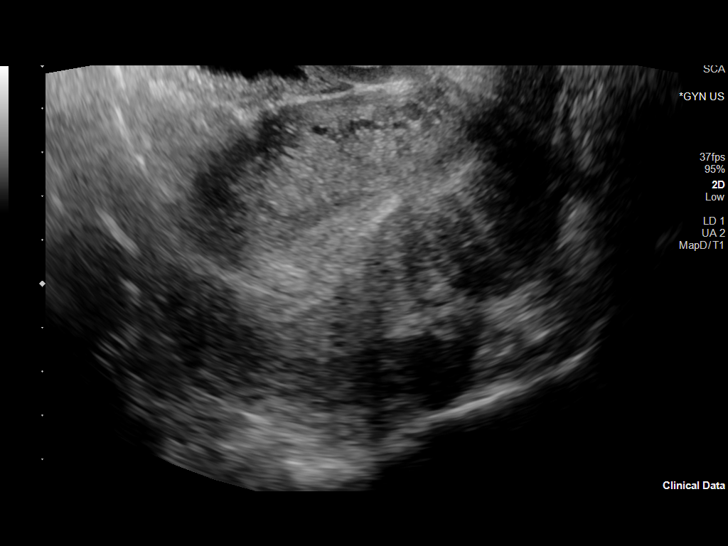
[im 80/88]
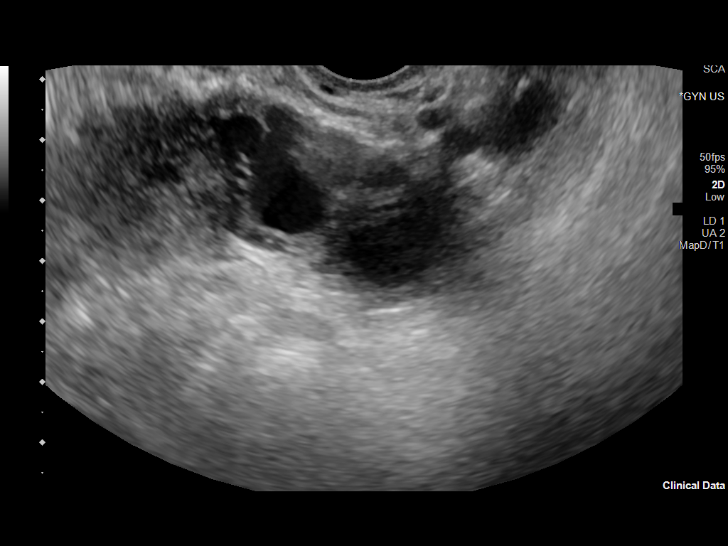
[im 88/88]
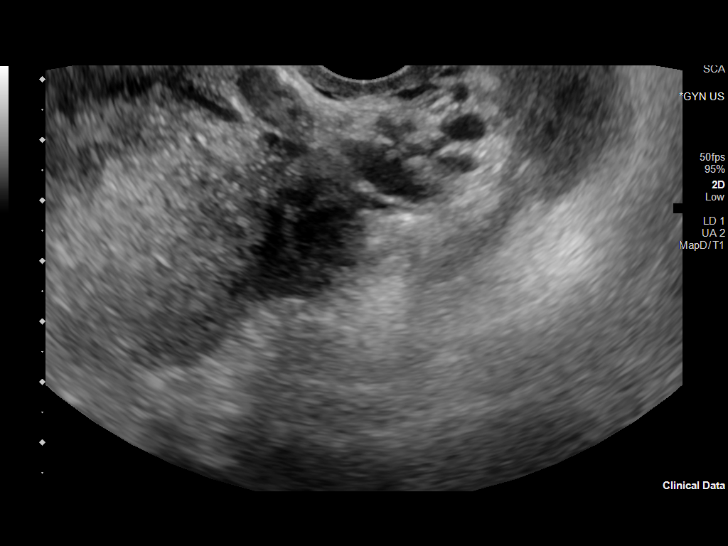

[13 of 25 positions shown; findings below may reference images not displayed]

FINDINGS: Uterus

Measurements: 11.2 x 8.1 x 7.5 cm = volume: 356 mL. Anteverted.
Heterogeneous myometrium. Multiple nodules consistent with
leiomyomata. Anterior fundal subserosal leiomyoma 3.6 cm greatest
diameter. Anterior wall intramural leiomyoma 2.4 cm diameter.
Posterior wall subserosal leiomyoma 3.0 cm. Additional subserosal
posterior upper uterine leiomyoma 3.6 cm.

Endometrium

Thickness: 19 mm.  No endometrial fluid or mass

Right ovary

Not visualized, likely obscured by bowel

Left ovary

Measurements: 2.1 x 1.7 x 2.3 cm = volume: 4.4 mL. Normal morphology
without mass

Other findings

No free pelvic fluid.  No adnexal masses.
IMPRESSION: Nonvisualization of RIGHT ovary.

Multiple uterine leiomyomata.

Thickened endometrial complex 19 mm thick; if bleeding remains
unresponsive to hormonal or medical therapy, focal lesion work-up
with sonohysterogram should be considered. Endometrial biopsy should
also be considered in pre-menopausal patients at high risk for
endometrial carcinoma. (Ref: Radiological Reasoning: Algorithmic
Workup of Abnormal Vaginal Bleeding with Endovaginal Sonography and
Sonohysterography. AJR [id number]; 191:S68-73)

## 2024-01-04 ENCOUNTER — Other Ambulatory Visit (HOSPITAL_BASED_OUTPATIENT_CLINIC_OR_DEPARTMENT_OTHER): Payer: Self-pay

## 2024-02-20 ENCOUNTER — Other Ambulatory Visit: Payer: Self-pay | Admitting: Physician Assistant

## 2024-02-20 ENCOUNTER — Other Ambulatory Visit (HOSPITAL_BASED_OUTPATIENT_CLINIC_OR_DEPARTMENT_OTHER): Payer: Self-pay

## 2024-02-20 DIAGNOSIS — F988 Other specified behavioral and emotional disorders with onset usually occurring in childhood and adolescence: Secondary | ICD-10-CM

## 2024-02-20 MED ORDER — LISDEXAMFETAMINE DIMESYLATE 70 MG PO CAPS
70.0000 mg | ORAL_CAPSULE | Freq: Every morning | ORAL | 0 refills | Status: DC
Start: 2024-02-20 — End: 2024-03-11
  Filled 2024-02-20: qty 15, 15d supply, fill #0

## 2024-02-20 NOTE — Telephone Encounter (Signed)
 Given enough until appt. Hasn't been see in one year

## 2024-02-25 ENCOUNTER — Other Ambulatory Visit (HOSPITAL_BASED_OUTPATIENT_CLINIC_OR_DEPARTMENT_OTHER): Payer: Self-pay

## 2024-03-04 ENCOUNTER — Encounter: Payer: Self-pay | Admitting: Physician Assistant

## 2024-03-04 DIAGNOSIS — Z8041 Family history of malignant neoplasm of ovary: Secondary | ICD-10-CM

## 2024-03-04 DIAGNOSIS — Z0000 Encounter for general adult medical examination without abnormal findings: Secondary | ICD-10-CM

## 2024-03-04 DIAGNOSIS — Z131 Encounter for screening for diabetes mellitus: Secondary | ICD-10-CM

## 2024-03-04 DIAGNOSIS — R944 Abnormal results of kidney function studies: Secondary | ICD-10-CM

## 2024-03-04 DIAGNOSIS — Z13220 Encounter for screening for lipoid disorders: Secondary | ICD-10-CM

## 2024-03-04 DIAGNOSIS — I10 Essential (primary) hypertension: Secondary | ICD-10-CM

## 2024-03-06 ENCOUNTER — Other Ambulatory Visit (HOSPITAL_BASED_OUTPATIENT_CLINIC_OR_DEPARTMENT_OTHER): Payer: Self-pay

## 2024-03-11 ENCOUNTER — Other Ambulatory Visit: Payer: Self-pay

## 2024-03-11 ENCOUNTER — Ambulatory Visit (INDEPENDENT_AMBULATORY_CARE_PROVIDER_SITE_OTHER): Admitting: Physician Assistant

## 2024-03-11 ENCOUNTER — Other Ambulatory Visit (HOSPITAL_BASED_OUTPATIENT_CLINIC_OR_DEPARTMENT_OTHER): Payer: Self-pay

## 2024-03-11 VITALS — BP 130/90 | HR 108 | Ht 68.0 in | Wt 230.0 lb

## 2024-03-11 DIAGNOSIS — E6609 Other obesity due to excess calories: Secondary | ICD-10-CM

## 2024-03-11 DIAGNOSIS — J0140 Acute pansinusitis, unspecified: Secondary | ICD-10-CM | POA: Diagnosis not present

## 2024-03-11 DIAGNOSIS — F988 Other specified behavioral and emotional disorders with onset usually occurring in childhood and adolescence: Secondary | ICD-10-CM

## 2024-03-11 DIAGNOSIS — Z0000 Encounter for general adult medical examination without abnormal findings: Secondary | ICD-10-CM | POA: Diagnosis not present

## 2024-03-11 DIAGNOSIS — F17200 Nicotine dependence, unspecified, uncomplicated: Secondary | ICD-10-CM | POA: Diagnosis not present

## 2024-03-11 DIAGNOSIS — T3695XA Adverse effect of unspecified systemic antibiotic, initial encounter: Secondary | ICD-10-CM | POA: Diagnosis not present

## 2024-03-11 DIAGNOSIS — Z6834 Body mass index (BMI) 34.0-34.9, adult: Secondary | ICD-10-CM

## 2024-03-11 DIAGNOSIS — E66811 Obesity, class 1: Secondary | ICD-10-CM

## 2024-03-11 DIAGNOSIS — Z716 Tobacco abuse counseling: Secondary | ICD-10-CM

## 2024-03-11 DIAGNOSIS — B379 Candidiasis, unspecified: Secondary | ICD-10-CM | POA: Diagnosis not present

## 2024-03-11 DIAGNOSIS — I10 Essential (primary) hypertension: Secondary | ICD-10-CM | POA: Diagnosis not present

## 2024-03-11 DIAGNOSIS — Z1231 Encounter for screening mammogram for malignant neoplasm of breast: Secondary | ICD-10-CM

## 2024-03-11 DIAGNOSIS — Z131 Encounter for screening for diabetes mellitus: Secondary | ICD-10-CM | POA: Diagnosis not present

## 2024-03-11 DIAGNOSIS — Z8041 Family history of malignant neoplasm of ovary: Secondary | ICD-10-CM | POA: Diagnosis not present

## 2024-03-11 DIAGNOSIS — R944 Abnormal results of kidney function studies: Secondary | ICD-10-CM | POA: Diagnosis not present

## 2024-03-11 DIAGNOSIS — J9801 Acute bronchospasm: Secondary | ICD-10-CM

## 2024-03-11 DIAGNOSIS — Z13220 Encounter for screening for lipoid disorders: Secondary | ICD-10-CM | POA: Diagnosis not present

## 2024-03-11 MED ORDER — FLUCONAZOLE 150 MG PO TABS
ORAL_TABLET | ORAL | 0 refills | Status: DC
  Filled 2024-03-11: qty 2, 2d supply, fill #0

## 2024-03-11 MED ORDER — LISDEXAMFETAMINE DIMESYLATE 70 MG PO CAPS
70.0000 mg | ORAL_CAPSULE | Freq: Every morning | ORAL | 0 refills | Status: DC
  Filled 2024-07-10: qty 30, 30d supply, fill #0

## 2024-03-11 MED ORDER — LISDEXAMFETAMINE DIMESYLATE 70 MG PO CAPS: 70.0000 mg | ORAL_CAPSULE | Freq: Every morning | ORAL | 0 refills | Status: DC

## 2024-03-11 MED ORDER — LISDEXAMFETAMINE DIMESYLATE 70 MG PO CAPS
70.0000 mg | ORAL_CAPSULE | Freq: Every morning | ORAL | 0 refills | Status: DC
  Filled 2024-03-11: qty 30, 30d supply, fill #0

## 2024-03-11 MED ORDER — HYDROCHLOROTHIAZIDE 25 MG PO TABS
25.0000 mg | ORAL_TABLET | Freq: Every day | ORAL | 3 refills | Status: AC
  Filled 2024-03-11 – 2024-04-09 (×2): qty 90, 90d supply, fill #0
  Filled 2024-07-14: qty 90, 90d supply, fill #1

## 2024-03-11 MED ORDER — AMOXICILLIN-POT CLAVULANATE 875-125 MG PO TABS
1.0000 | ORAL_TABLET | Freq: Two times a day (BID) | ORAL | 0 refills | Status: DC
  Filled 2024-03-11: qty 20, 10d supply, fill #0

## 2024-03-11 MED ORDER — AIRSUPRA 90-80 MCG/ACT IN AERO
2.0000 | INHALATION_SPRAY | Freq: Four times a day (QID) | RESPIRATORY_TRACT | 3 refills | Status: AC | PRN
  Filled 2024-03-11: qty 10.7, 30d supply, fill #0

## 2024-03-11 MED ORDER — BUPROPION HCL ER (SR) 150 MG PO TB12
150.0000 mg | ORAL_TABLET | Freq: Two times a day (BID) | ORAL | 1 refills | Status: AC
  Filled 2024-03-11: qty 180, 90d supply, fill #0

## 2024-03-11 MED ORDER — OMRON 3 SERIES BP MONITOR DEVI
1.0000 | 0 refills | Status: AC
  Filled 2024-03-11: qty 1, 30d supply, fill #0

## 2024-03-11 NOTE — Addendum Note (Signed)
 Addended by: Darryn Kydd A on: 03/11/2024 02:38 PM   Modules accepted: Orders

## 2024-03-11 NOTE — Progress Notes (Unsigned)
 Complete physical exam  Patient: Michelle Evans   DOB: [birth year]   48 y.o. Female  MRN: [id number]  Subjective:    No chief complaint on file.   Michelle Evans is a 48 y.o. female who presents today for a complete physical exam. She reports consuming a {diet types:17450} diet. {types:19826} She generally feels {DESC; WELL/FAIRLY WELL/POORLY:18703}. She reports sleeping {DESC; WELL/FAIRLY WELL/POORLY:18703}. She {does/does not:200015} have additional problems to discuss today.    Most recent fall risk assessment:    03/05/2023    9:01 AM  Fall Risk   Falls in the past year? 0  Number falls in past yr: 0  Injury with Fall? 0     Most recent depression screenings:    03/05/2023    9:20 AM 02/28/2022    2:17 PM  PHQ 2/9 Scores  PHQ - 2 Score 2 0  PHQ- 9 Score 6 3    {VISON DENTAL STD PSA (Optional):27386}  {History (Optional):23778}  Patient Care Team: Nesa Distel L, PA-C as PCP - General (Family Medicine)   Outpatient Medications Prior to Visit  Medication Sig   albuterol  (PROAIR  HFA) 108 (90 Base) MCG/ACT inhaler INHALE 2 PUFFS INTO THE LUNGS EVERY 6 HOURS AS NEEDED FOR WHEEZING   AMBULATORY NON FORMULARY MEDICATION Medication Name: Nitroglycerine 0.125 % apply pea size to affected area twice per day2 for 6-8 weeks   buPROPion  (WELLBUTRIN  SR) 150 MG 12 hr tablet Take 1 tablet (150 mg total) by mouth 2 (two) times daily.   Cyanocobalamin  (B-12 COMPLIANCE INJECTION) 1000 MCG/ML KIT Inject 1 mL as directed every 30 (thirty) days.   fluticasone  (FLONASE ) 50 MCG/ACT nasal spray Place 2 sprays into both nostrils daily.   hydrochlorothiazide  (HYDRODIURIL ) 25 MG tablet Take 1 tablet (25 mg total) by mouth daily.   ibuprofen  (ADVIL ) 800 MG tablet Take 1 tablet (800 mg total) by mouth 3 (three) times daily.   influenza vac split quadrivalent PF (FLUARIX  QUADRIVALENT) 0.5 ML injection Inject into the muscle.   LINZESS  145 MCG CAPS capsule TAKE 1 CAPSULE BY MOUTH ONCE DAILY    lisdexamfetamine (VYVANSE ) 70 MG capsule Take 1 capsule (70 mg total) by mouth every morning.   montelukast  (SINGULAIR ) 10 MG tablet TAKE 1 TABLET BY MOUTH EVERY NIGHT (Patient taking differently: Take by mouth as needed.)   Multiple Vitamin (MULTIVITAMIN WITH MINERALS) TABS tablet Take 1 tablet by mouth daily.   OVER THE COUNTER MEDICATION Take 1 capsule by mouth daily. Now Probiotic-10   Probiotic Product (PROBIOTIC ADVANCED PO) Take by mouth.   Probiotic, Lactobacillus, CAPS Take by mouth.   silver  sulfADIAZINE  (SILVADENE ) 1 % cream Apply 1 application topically to affected area 2 (two) times daily.   No facility-administered medications prior to visit.    ROS        Objective:     There were no vitals taken for this visit. BP Readings from Last 3 Encounters:  03/05/23 126/82  01/29/23 (!) 144/90  01/26/23 119/85   Wt Readings from Last 3 Encounters:  03/05/23 203 lb (92.1 kg)  02/28/22 188 lb (85.3 kg)  02/03/22 187 lb (84.8 kg)      Physical Exam      Assessment & Plan:    Routine Health Maintenance and Physical Exam  Immunization History  Administered Date(s) Administered   Influenza Split 05/24/2014, 05/25/2015, 04/18/2016, 05/15/2019   Influenza, Seasonal, Injecte, Preservative Fre 04/25/2023   Influenza,inj,Quad PF,6+ Mos 04/25/2021, 05/12/2022   Influenza,inj,Quad PF,6-35 Mos 05/24/2017  Influenza-Unspecified 05/24/2014, 05/25/2015, 04/18/2016, 05/15/2019   Moderna Sars-Covid-2 Vaccination 03/13/2020, 04/12/2020   Pneumococcal Polysaccharide-23 10/05/2015   Td 07/25/2003, 07/25/2003   Tdap 07/25/2003, 07/25/2003, 10/31/2018    Health Maintenance  Topic Date Due   HIV Screening  Never done   Hepatitis B Vaccines 19-59 Average Risk (1 of 3 - 19+ 3-dose series) Never done   MAMMOGRAM  03/07/2024   INFLUENZA VACCINE  02/22/2024   Colonoscopy  09/21/2028   DTaP/Tdap/Td (6 - Td or Tdap) 10/30/2028   Hepatitis C Screening  Completed   HPV VACCINES   Aged Out   Meningococcal B Vaccine  Aged Out   Pneumococcal Vaccine  Discontinued   COVID-19 Vaccine  Discontinued    Discussed health benefits of physical activity, and encouraged her to engage in regular exercise appropriate for her age and condition.   No follow-ups on file.     Johney Perotti, PA-C

## 2024-03-11 NOTE — Patient Instructions (Signed)

## 2024-03-12 ENCOUNTER — Encounter: Payer: Self-pay | Admitting: Physician Assistant

## 2024-03-12 ENCOUNTER — Ambulatory Visit: Payer: Self-pay | Admitting: Physician Assistant

## 2024-03-12 DIAGNOSIS — N1831 Chronic kidney disease, stage 3a: Secondary | ICD-10-CM | POA: Insufficient documentation

## 2024-03-12 DIAGNOSIS — E66811 Obesity, class 1: Secondary | ICD-10-CM | POA: Insufficient documentation

## 2024-03-12 DIAGNOSIS — J9801 Acute bronchospasm: Secondary | ICD-10-CM | POA: Insufficient documentation

## 2024-03-12 LAB — CMP14+EGFR
ALT: 19 IU/L (ref 0–32)
AST: 20 IU/L (ref 0–40)
Albumin: 4.5 g/dL (ref 3.9–4.9)
Alkaline Phosphatase: 72 IU/L (ref 44–121)
BUN/Creatinine Ratio: 8 — ABNORMAL LOW (ref 9–23)
BUN: 10 mg/dL (ref 6–24)
Bilirubin Total: 0.5 mg/dL (ref 0.0–1.2)
CO2: 22 mmol/L (ref 20–29)
Calcium: 9.8 mg/dL (ref 8.7–10.2)
Chloride: 99 mmol/L (ref 96–106)
Creatinine, Ser: 1.27 mg/dL — ABNORMAL HIGH (ref 0.57–1.00)
Globulin, Total: 3.2 g/dL (ref 1.5–4.5)
Glucose: 80 mg/dL (ref 70–99)
Potassium: 4.2 mmol/L (ref 3.5–5.2)
Sodium: 138 mmol/L (ref 134–144)
Total Protein: 7.7 g/dL (ref 6.0–8.5)
eGFR: 52 mL/min/1.73 — ABNORMAL LOW (ref >59)

## 2024-03-12 LAB — CBC WITH DIFFERENTIAL/PLATELET
Basophils Absolute: 0 x10E3/uL (ref 0.0–0.2)
Basos: 0 %
EOS (ABSOLUTE): 0 x10E3/uL (ref 0.0–0.4)
Eos: 0 %
Hematocrit: 47.1 % — ABNORMAL HIGH (ref 34.0–46.6)
Hemoglobin: 15.4 g/dL (ref 11.1–15.9)
Immature Grans (Abs): 0 x10E3/uL (ref 0.0–0.1)
Immature Granulocytes: 0 %
Lymphocytes Absolute: 3.6 x10E3/uL — ABNORMAL HIGH (ref 0.7–3.1)
Lymphs: 37 %
MCH: 31 pg (ref 26.6–33.0)
MCHC: 32.7 g/dL (ref 31.5–35.7)
MCV: 95 fL (ref 79–97)
Monocytes Absolute: 0.6 x10E3/uL (ref 0.1–0.9)
Monocytes: 6 %
Neutrophils Absolute: 5.2 x10E3/uL (ref 1.4–7.0)
Neutrophils: 57 %
Platelets: 237 x10E3/uL (ref 150–450)
RBC: 4.96 x10E6/uL (ref 3.77–5.28)
RDW: 13.1 % (ref 11.7–15.4)
WBC: 9.5 x10E3/uL (ref 3.4–10.8)

## 2024-03-12 LAB — B12 AND FOLATE PANEL
Folate: >20 ng/mL (ref >3.0)
Vitamin B-12: 1357 pg/mL — ABNORMAL HIGH (ref 232–1245)

## 2024-03-12 LAB — LIPID PANEL
Chol/HDL Ratio: 3.6 ratio (ref 0.0–4.4)
Cholesterol, Total: 197 mg/dL (ref 100–199)
HDL: 54 mg/dL (ref >39)
LDL Chol Calc (NIH): 121 mg/dL — ABNORMAL HIGH (ref 0–99)
Triglycerides: 122 mg/dL (ref 0–149)
VLDL Cholesterol Cal: 22 mg/dL (ref 5–40)

## 2024-03-12 LAB — CA 125: Cancer Antigen (CA) 125: 8.1 U/mL (ref 0.0–38.1)

## 2024-03-12 LAB — TSH+FREE T4
Free T4: 1.05 ng/dL (ref 0.82–1.77)
TSH: 1.86 u[IU]/mL (ref 0.450–4.500)

## 2024-03-12 LAB — VITAMIN D 25 HYDROXY (VIT D DEFICIENCY, FRACTURES): Vit D, 25-Hydroxy: 87.6 ng/mL (ref 30.0–100.0)

## 2024-03-12 NOTE — Progress Notes (Signed)
 [place],   CA125 low and stable.  Vitamin D  looks GREAT.  B12 a little out of normal range on the high side. Cut back by half of what you are taking now.  Thyroid  looks great.  LdL, bad cholesterol, not quite to goal and elevated from 1 year ago. Decrease fried/fatty/processed foods and work on 150 minutes of exercise a week.  Your kidney function is is stage 3a kidney disease. I would like to add a SGLT-2 to help prevent any further decline. Thoughts?

## 2024-03-14 ENCOUNTER — Other Ambulatory Visit (HOSPITAL_BASED_OUTPATIENT_CLINIC_OR_DEPARTMENT_OTHER): Payer: Self-pay

## 2024-03-14 MED ORDER — DAPAGLIFLOZIN PROPANEDIOL 10 MG PO TABS
10.0000 mg | ORAL_TABLET | Freq: Every day | ORAL | 3 refills | Status: AC
Start: 2024-03-14 — End: ?
  Filled 2024-03-14: qty 90, 90d supply, fill #0
  Filled 2024-05-28: qty 90, 90d supply, fill #1
  Filled 2024-05-28: qty 90, 90d supply, fill #0

## 2024-03-17 ENCOUNTER — Ambulatory Visit (HOSPITAL_BASED_OUTPATIENT_CLINIC_OR_DEPARTMENT_OTHER)
Admission: RE | Admit: 2024-03-17 | Discharge: 2024-03-17 | Disposition: A | Discharge status: Home / Self Care | Source: Ambulatory Visit | Attending: Physician Assistant | Admitting: Physician Assistant

## 2024-03-17 ENCOUNTER — Encounter (HOSPITAL_BASED_OUTPATIENT_CLINIC_OR_DEPARTMENT_OTHER): Payer: Self-pay

## 2024-03-17 ENCOUNTER — Other Ambulatory Visit (HOSPITAL_BASED_OUTPATIENT_CLINIC_OR_DEPARTMENT_OTHER): Payer: Self-pay

## 2024-03-17 DIAGNOSIS — Z1231 Encounter for screening mammogram for malignant neoplasm of breast: Secondary | ICD-10-CM | POA: Insufficient documentation

## 2024-03-21 ENCOUNTER — Other Ambulatory Visit: Payer: Self-pay | Admitting: Physician Assistant

## 2024-03-21 ENCOUNTER — Ambulatory Visit: Payer: Self-pay | Admitting: Physician Assistant

## 2024-03-21 DIAGNOSIS — R928 Other abnormal and inconclusive findings on diagnostic imaging of breast: Secondary | ICD-10-CM

## 2024-03-21 NOTE — Progress Notes (Signed)
 Breast clinic is going to reach out to you to schedule diagnostic imaging due to some asymmetry of the right breast.

## 2024-03-31 ENCOUNTER — Ambulatory Visit: Payer: Self-pay | Admitting: Physician Assistant

## 2024-03-31 ENCOUNTER — Ambulatory Visit
Admission: RE | Admit: 2024-03-31 | Discharge: 2024-03-31 | Disposition: A | Discharge status: Home / Self Care | Source: Ambulatory Visit | Attending: Physician Assistant | Admitting: Physician Assistant

## 2024-03-31 DIAGNOSIS — R928 Other abnormal and inconclusive findings on diagnostic imaging of breast: Secondary | ICD-10-CM

## 2024-03-31 DIAGNOSIS — N6011 Diffuse cystic mastopathy of right breast: Secondary | ICD-10-CM | POA: Diagnosis not present

## 2024-03-31 DIAGNOSIS — N6001 Solitary cyst of right breast: Secondary | ICD-10-CM | POA: Insufficient documentation

## 2024-03-31 NOTE — Progress Notes (Signed)
 GREAT news. Benign BREAST cyst!

## 2024-04-09 ENCOUNTER — Other Ambulatory Visit (HOSPITAL_BASED_OUTPATIENT_CLINIC_OR_DEPARTMENT_OTHER): Payer: Self-pay

## 2024-04-16 ENCOUNTER — Other Ambulatory Visit: Payer: Self-pay | Admitting: Physician Assistant

## 2024-04-16 DIAGNOSIS — F988 Other specified behavioral and emotional disorders with onset usually occurring in childhood and adolescence: Secondary | ICD-10-CM

## 2024-04-18 ENCOUNTER — Other Ambulatory Visit (HOSPITAL_BASED_OUTPATIENT_CLINIC_OR_DEPARTMENT_OTHER): Payer: Self-pay

## 2024-04-18 MED ORDER — LISDEXAMFETAMINE DIMESYLATE 70 MG PO CAPS
70.0000 mg | ORAL_CAPSULE | Freq: Every morning | ORAL | 0 refills | Status: DC
  Filled 2024-05-28: qty 30, 30d supply, fill #0

## 2024-04-18 MED ORDER — LISDEXAMFETAMINE DIMESYLATE 70 MG PO CAPS
70.0000 mg | ORAL_CAPSULE | Freq: Every morning | ORAL | 0 refills | Status: DC
  Filled 2024-04-18: qty 30, 30d supply, fill #0

## 2024-05-05 ENCOUNTER — Other Ambulatory Visit (HOSPITAL_BASED_OUTPATIENT_CLINIC_OR_DEPARTMENT_OTHER): Payer: Self-pay

## 2024-05-05 ENCOUNTER — Other Ambulatory Visit (HOSPITAL_COMMUNITY): Payer: Self-pay

## 2024-05-05 MED ORDER — FLUZONE 0.5 ML IM SUSY
0.5000 mL | PREFILLED_SYRINGE | Freq: Once | INTRAMUSCULAR | 0 refills | Status: AC
  Filled 2024-05-05: qty 0.5, 1d supply, fill #0

## 2024-05-28 ENCOUNTER — Other Ambulatory Visit: Payer: Self-pay

## 2024-05-28 ENCOUNTER — Other Ambulatory Visit (HOSPITAL_BASED_OUTPATIENT_CLINIC_OR_DEPARTMENT_OTHER): Payer: Self-pay

## 2024-05-29 ENCOUNTER — Other Ambulatory Visit: Payer: Self-pay

## 2024-07-10 ENCOUNTER — Other Ambulatory Visit (HOSPITAL_BASED_OUTPATIENT_CLINIC_OR_DEPARTMENT_OTHER): Payer: Self-pay

## 2024-07-21 ENCOUNTER — Encounter: Payer: Self-pay | Admitting: Physician Assistant

## 2024-07-25 ENCOUNTER — Ambulatory Visit: Admitting: Physician Assistant

## 2024-07-25 ENCOUNTER — Other Ambulatory Visit: Payer: Self-pay

## 2024-07-25 ENCOUNTER — Other Ambulatory Visit (HOSPITAL_BASED_OUTPATIENT_CLINIC_OR_DEPARTMENT_OTHER): Payer: Self-pay

## 2024-07-25 ENCOUNTER — Encounter: Payer: Self-pay | Admitting: Physician Assistant

## 2024-07-25 VITALS — BP 137/82 | HR 92 | Ht 68.0 in | Wt 235.0 lb

## 2024-07-25 DIAGNOSIS — F988 Other specified behavioral and emotional disorders with onset usually occurring in childhood and adolescence: Secondary | ICD-10-CM

## 2024-07-25 DIAGNOSIS — L439 Lichen planus, unspecified: Secondary | ICD-10-CM | POA: Insufficient documentation

## 2024-07-25 DIAGNOSIS — K429 Umbilical hernia without obstruction or gangrene: Secondary | ICD-10-CM | POA: Insufficient documentation

## 2024-07-25 MED ORDER — LISDEXAMFETAMINE DIMESYLATE 70 MG PO CAPS
70.0000 mg | ORAL_CAPSULE | Freq: Every morning | ORAL | 0 refills | Status: AC
  Filled 2024-07-25 – 2024-08-20 (×2): qty 30, 30d supply, fill #0

## 2024-07-25 MED ORDER — CLOBETASOL PROPIONATE 0.05 % EX CREA
1.0000 | TOPICAL_CREAM | Freq: Two times a day (BID) | CUTANEOUS | 2 refills | Status: AC
  Filled 2024-07-25: qty 60, 30d supply, fill #0

## 2024-07-25 MED ORDER — LISDEXAMFETAMINE DIMESYLATE 70 MG PO CAPS: 70.0000 mg | ORAL_CAPSULE | Freq: Every morning | ORAL | 0 refills | Status: AC

## 2024-07-25 NOTE — Patient Instructions (Signed)
 Umbilical Hernia, Adult  A hernia is a lump of tissue that pushes through an opening in the muscles. An umbilical hernia happens in the belly, near the belly button. The hernia may contain tissues from the small or large intestine. It may also have fatty tissue that covers the intestines. Umbilical hernias in adults may get worse over time. They need to be treated with surgery. There are several types of umbilical hernias. They include: Indirect hernia. This occurs just above or below the belly button. It's the most common type of umbilical hernia in adults. Direct hernia. This type occurs in an opening that's formed by the belly button. Reducible hernia. This hernia comes and goes. You may see it only when you strain, cough, or lift something heavy. This type of hernia can be pushed back into the belly (reduced). Incarcerated hernia. This traps the hernia in the wall of the belly. This type of hernia can't be pushed back into the belly. It can cause a strangulated hernia. Strangulated hernia. This hernia cuts off blood flow to the tissues inside the hernia. The tissues can die if this happens. This type of hernia must be treated right away. What are the causes? An umbilical hernia happens when tissue inside the belly pushes through an opening in the muscles of the belly. What increases the risk? You're more likely to get this hernia if: You strain while lifting or pushing heavy objects. You've had several pregnancies. You have a condition that puts pressure on your belly, and you've had it for a long time. These include: Obesity. A buildup of fluid inside your belly. Vomiting or coughing all the time. Trouble pooping (constipation). You've had surgery that weakened the muscles in the belly. What are the signs or symptoms? The main symptom of this condition is a bulge at the belly button or near it. The bulge does not cause pain. Other symptoms depend on the type of hernia you have. A  reducible hernia may be seen only when you strain, cough, or lift something heavy. Other symptoms may include: Dull pain. A feeling of pressure. An incarcerated hernia may cause very bad pain. Also, you may: Vomit or feel like you may vomit. Not be able to pass gas. A strangulated hernia may cause: Pain that gets worse and worse. Vomiting, or feeling like you may vomit. Pain when you press on the hernia. Change of color on the skin over the hernia. The skin may become red or purple. Trouble pooping. Blood in the poop. How is this diagnosed? This condition may be diagnosed based on: Your symptoms and medical history. A physical exam. You may be asked to cough or strain while standing. These actions will put pressure inside your belly. The pressure can force the hernia through the opening in your muscles. Your health care provider may try to push the hernia back into your belly (reduce). How is this treated? Surgery is the only treatment for an umbilical hernia. Surgery for a strangulated hernia must be done right away. If you have a small hernia that's not incarcerated, you may need to lose weight before the surgery is done. Follow these instructions at home: Managing constipation You may need to take these actions to prevent trouble pooping. This will help to prevent straining. Drink enough fluid to keep your pee (urine) pale yellow. Take over-the-counter or prescription medicines. Eat foods that are high in fiber, such as beans, whole grains, and fresh fruits and vegetables. Limit foods that are high in  fat and sugars, such as fried or sweet foods. General instructions Do not try to push the hernia back in. Lose weight, if told by your provider. Watch your hernia for any changes in color or size. Tell your provider if any changes occur. You may need to avoid activities that put pressure on your hernia. You may have to avoid lifting. Ask your provider how much you can safely  lift. Take over-the-counter and prescription medicines only as told by your provider. Contact a health care provider if: Your hernia gets larger or feels hard. Your hernia becomes painful. You get a fever or chills. Get help right away if: You get very bad pain near the area of the hernia, and the pain comes on suddenly. You have pain and you vomit or feel like you may vomit. The skin over your hernia changes color. These symptoms may be an emergency. Get help right away. Call 911. Do not wait to see if the symptoms go away. Do not drive yourself to the hospital. This information is not intended to replace advice given to you by your health care provider. Make sure you discuss any questions you have with your health care provider. Document Revised: 10/31/2022 Document Reviewed: 10/31/2022 Elsevier Patient Education  2024 [employer].

## 2024-07-25 NOTE — Progress Notes (Signed)
 "  Established Patient Office Visit  Subjective   Patient ID: Michelle Evans, female    DOB: [birth year]  Age: 49 y.o. MRN: [id number]  Chief Complaint  Patient presents with   Umbilical Hernia    Onset since Friday, using hernia belt for support, pt did not go to the hospital    HPI .Discussed the use of AI scribe software for clinical note transcription with the patient, who gave verbal consent to proceed.  History of Present Illness Michelle Evans is a 49 year old female who presents with severe abdominal pain and a protruding mass.  Severe abdominal pain and protruding abdominal mass - Severe abdominal pain, rated as 'twenty' on the pain scale - Onset during a family gathering - Pain intensity prevents standing up straight, coughing without support, or turning on her side - Visible abdominal protrusion observed by patient and family members - Application of a band provided some relief - No hospital evaluation at the time due to fear  Pain now has resolved and tender to touch.   Bowel habits and laxative use - Regular bowel movements currently - Improvement in bowel frequency since starting two stool softeners and two gummy fibers daily - Previously experienced up to one week without a bowel movement  Chronic pruritic foot growth - Longstanding growth on foot, present for years - Lesion is increasing in size - Primary symptom is pruritus - Previously managed with triamcinolone cream, which is now depleted  Medication refill needs - Currently taking Vyvanse  - Due for medication refill     ROS See HPI.    Objective:     BP (!) 147/86   Pulse 92   Ht 5' 8 (1.727 m)   Wt 235 lb (106.6 kg)   LMP 09/18/2021   SpO2 99%   BMI 35.73 kg/m  BP Readings from Last 3 Encounters:  07/25/24 137/82  03/11/24 (!) 130/90  03/05/23 126/82   Wt Readings from Last 3 Encounters:  07/25/24 235 lb (106.6 kg)  03/11/24 230 lb (104.3 kg)  03/05/23 203 lb (92.1 kg)       Physical Exam Constitutional:      Appearance: Normal appearance. She is obese.  HENT:     Head: Normocephalic.  Cardiovascular:     Rate and Rhythm: Normal rate and regular rhythm.  Pulmonary:     Effort: Pulmonary effort is normal.  Abdominal:     General: Bowel sounds are normal. There is no distension.     Palpations: Abdomen is soft.     Tenderness: There is abdominal tenderness. There is no right CVA tenderness, left CVA tenderness, guarding or rebound.     Hernia: A hernia is present.     Comments: Bulging above umbilicus about 3cm by 3cm soft, slightly tender, mobile, reducible.   Skin:    Comments: 1cm by 2cm raised purple rough patch with fine scales on right lateral foot.   Neurological:     General: No focal deficit present.     Mental Status: She is alert and oriented to person, place, and time.  Psychiatric:        Mood and Affect: Mood normal.      The 10-year ASCVD risk score (Arnett DK, et al., 2019) is: 5.8%    Assessment & Plan:  .Brei was seen today for umbilical hernia.  Diagnoses and all orders for this visit:  Umbilical hernia without obstruction and without gangrene -     Ambulatory referral to General  Surgery  Attention deficit disorder (ADD) without hyperactivity -     lisdexamfetamine  (VYVANSE ) 70 MG capsule; Take 1 capsule (70 mg total) by mouth every morning. -     lisdexamfetamine  (VYVANSE ) 70 MG capsule; Take 1 capsule (70 mg total) by mouth every morning. -     lisdexamfetamine  (VYVANSE ) 70 MG capsule; Take 1 capsule (70 mg total) by mouth every morning.  Lichen planus -     clobetasol cream (TEMOVATE) 0.05 %; Apply 1 Application topically 2 (two) times daily.   Assessment & Plan Umbilical hernia Recent severe pain and protrusion, currently tender. Risk of recurrence and complications without surgery. - Referred to general surgery for evaluation and potential surgical intervention. - Discussed risk of strangulated hernia and need  to go to ED, patient is aware.  - Patient is having good bowel movements and pain has resolved.  - Discussed potential need for imaging as determined by surgical team.  Chronic skin lesion of right foot Chronic lesion increasing in size with significant itching, suggesting chronic inflammation and looks consistent with lichen planus.  - Prescribed clobetasol for topical application bid as needed.   Attention-deficit hyperactivity disorder Requires ongoing management with medication. - Sent in refills for Vyvanse .     Michelle Kobler, PA-C  "

## 2024-08-08 ENCOUNTER — Encounter: Payer: Self-pay | Admitting: Physician Assistant

## 2024-08-08 MED ORDER — WEGOVY 0.5 MG/0.5ML ~~LOC~~ SOAJ
0.5000 mg | SUBCUTANEOUS | 1 refills | Status: AC
  Filled 2024-08-08 – 2024-08-28 (×2): qty 2, 28d supply, fill #0

## 2024-08-08 MED ORDER — WEGOVY 0.25 MG/0.5ML ~~LOC~~ SOAJ
0.2500 mg | SUBCUTANEOUS | 0 refills | Status: AC
  Filled 2024-08-08: qty 2, 28d supply, fill #0

## 2024-08-10 ENCOUNTER — Other Ambulatory Visit (HOSPITAL_BASED_OUTPATIENT_CLINIC_OR_DEPARTMENT_OTHER): Payer: Self-pay

## 2024-08-11 ENCOUNTER — Other Ambulatory Visit: Payer: Self-pay

## 2024-08-20 ENCOUNTER — Other Ambulatory Visit (HOSPITAL_BASED_OUTPATIENT_CLINIC_OR_DEPARTMENT_OTHER): Payer: Self-pay

## 2024-08-28 ENCOUNTER — Other Ambulatory Visit (HOSPITAL_BASED_OUTPATIENT_CLINIC_OR_DEPARTMENT_OTHER): Payer: Self-pay
